# Patient Record
Sex: Male | Born: 1955 | Race: White | Hispanic: No | Marital: Single | Smoking: Never smoker
Health system: Southern US, Community
[De-identification: ages and names within clinical notes are randomized; demographics above are authoritative.]

## PROBLEM LIST (undated history)

## (undated) DIAGNOSIS — D751 Secondary polycythemia: Secondary | ICD-10-CM

## (undated) DIAGNOSIS — E538 Deficiency of other specified B group vitamins: Secondary | ICD-10-CM

## (undated) DIAGNOSIS — E119 Type 2 diabetes mellitus without complications: Secondary | ICD-10-CM

## (undated) DIAGNOSIS — E785 Hyperlipidemia, unspecified: Secondary | ICD-10-CM

## (undated) DIAGNOSIS — I10 Essential (primary) hypertension: Secondary | ICD-10-CM

## (undated) DIAGNOSIS — G709 Myoneural disorder, unspecified: Secondary | ICD-10-CM

## (undated) DIAGNOSIS — K760 Fatty (change of) liver, not elsewhere classified: Secondary | ICD-10-CM

## (undated) DIAGNOSIS — F1020 Alcohol dependence, uncomplicated: Secondary | ICD-10-CM

## (undated) HISTORY — PX: KNEE ARTHROSCOPY: SUR90

## (undated) HISTORY — PX: NECK SURGERY: SHX720

## (undated) HISTORY — DX: Secondary polycythemia: D75.1

---

## 1998-08-29 ENCOUNTER — Encounter: Payer: Self-pay | Admitting: Specialist

## 1998-08-29 ENCOUNTER — Ambulatory Visit (HOSPITAL_COMMUNITY): Admission: RE | Admit: 1998-08-29 | Discharge: 1998-08-29 | Payer: Self-pay | Admitting: Specialist

## 1999-02-28 ENCOUNTER — Encounter: Payer: Self-pay | Admitting: Neurosurgery

## 1999-02-28 ENCOUNTER — Encounter: Admission: RE | Admit: 1999-02-28 | Discharge: 1999-02-28 | Payer: Self-pay | Admitting: Neurosurgery

## 1999-09-13 ENCOUNTER — Encounter: Admission: RE | Admit: 1999-09-13 | Discharge: 1999-09-13 | Payer: Self-pay | Admitting: Family Medicine

## 1999-09-13 ENCOUNTER — Encounter: Payer: Self-pay | Admitting: Family Medicine

## 2000-09-07 ENCOUNTER — Ambulatory Visit (HOSPITAL_COMMUNITY): Admission: RE | Admit: 2000-09-07 | Discharge: 2000-09-07 | Payer: Self-pay | Admitting: *Deleted

## 2009-05-24 ENCOUNTER — Encounter (INDEPENDENT_AMBULATORY_CARE_PROVIDER_SITE_OTHER): Payer: Self-pay | Admitting: *Deleted

## 2009-06-04 ENCOUNTER — Encounter (INDEPENDENT_AMBULATORY_CARE_PROVIDER_SITE_OTHER): Payer: Self-pay

## 2009-06-05 ENCOUNTER — Ambulatory Visit: Payer: Self-pay | Admitting: Gastroenterology

## 2009-06-05 ENCOUNTER — Encounter (INDEPENDENT_AMBULATORY_CARE_PROVIDER_SITE_OTHER): Payer: Self-pay

## 2009-06-19 ENCOUNTER — Ambulatory Visit: Payer: Self-pay | Admitting: Gastroenterology

## 2009-06-21 ENCOUNTER — Encounter: Payer: Self-pay | Admitting: Gastroenterology

## 2010-04-18 NOTE — Letter (Signed)
Summary: Results Letter  [facility]  [address] [place], [place] [address]   Phone: [phone]  Fax: [phone]        June 21, 2009 MRN: [id number]    [facility] [address] [place], [place]  [address]    Dear Johnny Beck,   One of the polyps removed during your recent procedure was proven to be adenomatous.  These are pre-cancerous polyps that may have grown into cancers if they had not been removed.  Based on current nationally recognized surveillance guidelines, I recommend that you have a repeat colonoscopy in 5 years.  We will therefore put your information in our reminder system and will contact you in 5 years to schedule a repeat procedure.  Please call if you have any questions or concerns.       Sincerely,  Rachael Fee MD  This letter has been electronically signed by your physician.  Appended Document: Results Letter Letter mailed 4.8.11

## 2010-04-18 NOTE — Letter (Signed)
Summary: Diabetic Instructions  [facility]  [address] [place], [place] [address]   Phone: [phone]  Fax: [phone]    PADEN SENGER [birth year] MRN: [id number]   _  _   ORAL DIABETIC MEDICATION INSTRUCTIONS  The day before your procedure:   Take your diabetic pill as you do normally  The day of your procedure:   Do not take your diabetic pill    We will check your blood sugar levels during the admission process and again in Recovery before discharging you home  ________________________________________________________________________  _  _   INSULIN (LONG ACTING) MEDICATION INSTRUCTIONS (Lantus, NPH, 70/30, Humulin, Novolin-N)   The day before your procedure:   Take  your regular evening dose    The day of your procedure:   Do not take your morning dose    _  _   INSULIN (SHORT ACTING) MEDICATION INSTRUCTIONS (Regular, Humulog, Novolog)   The day before your procedure:   Do not take your evening dose   The day of your procedure:   Do not take your morning dose   _  _   INSULIN PUMP MEDICATION INSTRUCTIONS  We will contact the physician managing your diabetic care for written dosage instructions for the day before your procedure and the day of your procedure.  Once we have received the instructions, we will contact you.

## 2010-04-18 NOTE — Miscellaneous (Signed)
Summary: Lec previsit  Clinical Lists Changes  Medications: Added new medication of MOVIPREP 100 GM  SOLR (PEG-KCL-NACL-NASULF-NA ASC-C) As per prep instructions. - Signed Rx of MOVIPREP 100 GM  SOLR (PEG-KCL-NACL-NASULF-NA ASC-C) As per prep instructions.;  #1 x 0;  Signed;  Entered by: Ulis Rias RN;  Authorized by: Rachael Fee MD;  Method used: Electronically to [id number] [address], [place], [place], [place]  [address], Ph: [phone], Fax: [phone] Allergies: Added new allergy or adverse reaction of CODEINE Added new allergy or adverse reaction of PENICILLIN Observations: Added new observation of NKA: F (06/05/2009 14:52)    Prescriptions: MOVIPREP 100 GM  SOLR (PEG-KCL-NACL-NASULF-NA ASC-C) As per prep instructions.  #1 x 0   Entered by:   Ulis Rias RN   Authorized by:   Rachael Fee MD   Signed by:   Ulis Rias RN on 06/05/2009   Method used:   [facility]  [address] [id number]       [address]       [place], [place]  [address]       Ph: [phone]       Fax: [phone]   RxID:   [id number]

## 2010-04-18 NOTE — Letter (Signed)
Summary: Previsit letter  [facility]  [address] [place], [place] [address]   Phone: [phone]  Fax: [phone]       05/24/2009 MRN: [id number]  [facility] [address] [place], [place]  [address]  Dear Mr. Germano,  Welcome to the Gastroenterology Division at [facility].    You are scheduled to see a nurse for your pre-procedure visit on 06-05-09 at 3:30P.M. on the 3rd floor at [facility], [address]  We ask that you try to arrive at our office 15 minutes prior to your appointment time to allow for check-in.  Your nurse visit will consist of discussing your medical and surgical history, your immediate family medical history, and your medications.    Please bring a complete list of all your medications or, if you prefer, bring the medication bottles and we will list them.  We will need to be aware of both prescribed and over the counter drugs.  We will need to know exact dosage information as well.  If you are on blood thinners (Coumadin, Plavix, Aggrenox, Ticlid, etc.) please call our office today/prior to your appointment, as we need to consult with your physician about holding your medication.   Please be prepared to read and sign documents such as consent forms, a financial agreement, and acknowledgement forms.  If necessary, and with your consent, a friend or relative is welcome to sit-in on the nurse visit with you.  Please bring your insurance card so that we may make a copy of it.  If your insurance requires a referral to see a specialist, please bring your referral form from your primary care physician.  No co-pay is required for this nurse visit.     If you cannot keep your appointment, please call [phone] to cancel or reschedule prior to your appointment date.  This allows [place] the opportunity to schedule an appointment for another patient in need of care.    Thank you for choosing [facility] for your medical needs.   We appreciate the opportunity to care for you.  Please visit [place] at our website  to learn more about our practice.                     Sincerely.                                                                                                                   The Gastroenterology Division

## 2010-04-18 NOTE — Letter (Signed)
Summary: [facility] Instructions  [facility]  [address] [place], [place] [address]   Phone: [phone]  Fax: [phone]       Johnny Beck    07/21/1955    MRN: [id number]        Procedure Day /Date:  Tuesday 06/19/2009     Arrival Time: 10:00 am      Procedure Time: 11:00 am     Location of Procedure:                    _ x_  [facility] (4th Floor)                        PREPARATION FOR COLONOSCOPY WITH MOVIPREP   Starting 5 days prior to your procedure Thursday 3/31 do not eat nuts, seeds, popcorn, corn, beans, peas,  salads, or any raw vegetables.  Do not take any fiber supplements (e.g. Metamucil, Citrucel, and Benefiber).  THE DAY BEFORE YOUR PROCEDURE         DATE: Mondaay 4/4  1.  Drink clear liquids the entire day-NO SOLID FOOD  2.  Do not drink anything colored red or purple.  Avoid juices with pulp.  No orange juice.  3.  Drink at least 64 oz. (8 glasses) of fluid/clear liquids during the day to prevent dehydration and help the prep work efficiently.  CLEAR LIQUIDS INCLUDE: Water Jello Ice Popsicles Tea (sugar ok, no milk/cream) Powdered fruit flavored drinks Coffee (sugar ok, no milk/cream) Gatorade Juice: apple, white grape, white cranberry  Lemonade Clear bullion, consomm, broth Carbonated beverages (any kind) Strained chicken noodle soup Hard Candy                             4.  In the morning, mix first dose of MoviPrep solution:    Empty 1 Pouch A and 1 Pouch B into the disposable container    Add lukewarm drinking water to the top line of the container. Mix to dissolve    Refrigerate (mixed solution should be used within 24 hrs)  5.  Begin drinking the prep at 5:00 p.m. The MoviPrep container is divided by 4 marks.   Every 15 minutes drink the solution down to the next mark (approximately 8 oz) until the full liter is complete.   6.  Follow completed prep with 16 oz of clear liquid of your choice (Nothing  red or purple).  Continue to drink clear liquids until bedtime.  7.  Before going to bed, mix second dose of MoviPrep solution:    Empty 1 Pouch A and 1 Pouch B into the disposable container    Add lukewarm drinking water to the top line of the container. Mix to dissolve    Refrigerate  THE DAY OF YOUR PROCEDURE      DATE: Tuesday 4/5  Beginning at 6:00 a.m. (5 hours before procedure):         1. Every 15 minutes, drink the solution down to the next mark (approx 8 oz) until the full liter is complete.  2. Follow completed prep with 16 oz. of clear liquid of your choice.    3. You may drink clear liquids until 9:00 am  (2 HOURS BEFORE PROCEDURE).   MEDICATION INSTRUCTIONS  Unless otherwise instructed, you should take regular prescription medications with a small sip of water   as early as possible the morning  of your procedure.  Diabetic patients - see separate instructions         OTHER INSTRUCTIONS  You will need a responsible adult at least 54 years of age to accompany you and drive you home.   This person must remain in the waiting room during your procedure.  Wear loose fitting clothing that is easily removed.  Leave jewelry and other valuables at home.  However, you may wish to bring a book to read or  an iPod/MP3 player to listen to music as you wait for your procedure to start.  Remove all body piercing jewelry and leave at home.  Total time from sign-in until discharge is approximately 2-3 hours.  You should go home directly after your procedure and rest.  You can resume normal activities the  day after your procedure.  The day of your procedure you should not:   Drive   Make legal decisions   Operate machinery   Drink alcohol   Return to work  You will receive specific instructions about eating, activities and medications before you leave.    The above instructions have been reviewed and explained to me by   Ulis Rias RN  June 05, 2009  3:50 PM     I fully understand and can verbalize these instructions _____________________________ Date _________

## 2010-04-18 NOTE — Procedures (Signed)
Summary: Colonoscopy  Patient: Johnny Beck Note: All result statuses are Final unless otherwise noted.  Tests: (1) Colonoscopy (COL)   COL Colonoscopy           DONE     [facility]     [address]     [place], [place]  [address]           COLONOSCOPY PROCEDURE REPORT     PATIENT:  [name], [name]  MR#:  [id number]     BIRTHDATE:  1956/03/05, 54 yrs. old  GENDER:  male     ENDOSCOPIST:  Rachael Fee, MD     REF. BY:  Tomi Bamberger, NP     PROCEDURE DATE:  06/19/2009     PROCEDURE:  Colonoscopy with snare polypectomy     ASA CLASS:  Class II     INDICATIONS:  Routine Risk Screening     MEDICATIONS:   Fentanyl 100 mcg IV, Versed 10 mg IV     DESCRIPTION OF PROCEDURE:   After the risks benefits and     alternatives of the procedure were thoroughly explained, informed     consent was obtained.  Digital rectal exam was performed and     revealed no rectal masses.   The LB CF-H180AL [id number] endoscope     was introduced through the anus and advanced to the cecum, which     was identified by both the appendix and ileocecal valve, without     limitations.  The quality of the prep was adequate, using     MoviPrep.  The instrument was then slowly withdrawn as the colon     was fully examined.     <<PROCEDUREIMAGES>>     FINDINGS:  Three sessile polyps were found, removed with cold     snare, all were retrieved and sent to pathology (jar 1). These     ranged in size from 3mm to 5mm. One was in descending colon, one     in sigmoid colon, one in rectum (see image3, image4, and image5).     This was otherwise a normal examination of the colon (see image6,     image2, and image1).   Retroflexed views in the rectum revealed no     abnormalities.    The scope was then withdrawn from the patient     and the procedure completed.           COMPLICATIONS:  None     ENDOSCOPIC IMPRESSION:     1) Three small polyps; all were removed and sent to pathology     (all <1cm in size)     2)  Otherwise normal examination           RECOMMENDATIONS:     1) If the polyp(s) removed today are proven to be adenomatous     (pre-cancerous) polyps, you will need a colonoscopy in 3-5 years.     Otherwise you should continue to follow colorectal cancer     screening guidelines for "routine risk" patients with a     colonoscopy in 10 years.     2) You will receive a letter within 1-2 weeks with the results     of your biopsy as well as final recommendations. Please call my     office if you have not received a letter after 3 weeks.           ______________________________     Rachael Fee, MD  n.     eSIGNED:   Rachael Fee at 06/19/2009 11:42 AM           Lindie Spruce, [phone]  Note: An exclamation mark (!) indicates a result that was not dispersed into the flowsheet. Document Creation Date: 06/19/2009 11:43 AM _______________________________________________________________________  (1) Order result status: Final Collection or observation date-time: 06/19/2009 11:39 Requested date-time:  Receipt date-time:  Reported date-time:  Referring Physician:   Ordering Physician: Rob Bunting [id number]) Specimen Source:  Source: Launa Grill Order Number: [id number] Lab site:   Appended Document: Colonoscopy     Procedures Next Due Date:    Colonoscopy: 06/2014

## 2010-06-05 LAB — GLUCOSE, CAPILLARY
Glucose-Capillary: 104 mg/dL — ABNORMAL HIGH (ref 70–99)
Glucose-Capillary: 133 mg/dL — ABNORMAL HIGH (ref 70–99)

## 2010-08-02 NOTE — Cardiovascular Report (Signed)
[facility]  Patient:    Johnny Beck, Johnny Beck                       MRN: [id number] Proc. Date: 09/07/00 Adm. Date:  [id number] Disc. Date: [id number] Attending:  Meade Maw A                        Cardiac Catheterization  INDICATIONS FOR PROCEDURE:  The patient is a 55 year old gentleman with complaints of intermittent chest pain, dyspnea with exertion which has become progressively worse, associated symptoms including peripheral edema. Transesophageal echocardiography was performed and was suggestive of an ejection fraction of 40-50%.  DESCRIPTION OF PROCEDURE:  After obtaining written informed consent, the patient was brought to the cardiac catheterization lab in the postabsorptive state.  Preoperative sedation was achieved using IV Versed.  The right groin was prepped and draped in a usual sterile fashion.  Local anesthesia was achieved using 1% Xylocaine.  IV sedation was achieved using IV Versed and IV fentanyl.  After localization, an 8 [place] hemostasis sheath was placed into the right femoral vein using a modified Seldinger technique.  A 6 French hemostasis sheath was placed into the right femoral artery using the modified Seldinger technique.  Pulmonary artery pressure, right ventricular pressure and right atrial pressure were obtained.  O2 saturations from the pulmonary artery and arterial saturations were obtained to perform Fick cardiac output. Following right heart catheterization, a single plane ventriculogram was performed in the RAO position using a 6 French pigtail Cook catheter. Selective coronary angiography was then performed using a JL4 and a non torque right coronary artery.  All catheter exchange were made over a guide wire. The hemostasis sheath was flushed following each engagement.  The films were then reviewed.  There was no identifiable coronary artery disease.  FINDINGS:  The aortic pressure was 142/92, LV pressure was  146/19.  There was no gradient noted on pullback.  Pulmonary artery pressure was 23/14, wedge pressure with a mean of 12, right ventricular pressure 29/12, right atrial pressure 15/11.  Cardiac outputs by thermodilution was 6.81, cardiac index was 2.77.  At the time of this dictation, O2 saturations from the pulmonary artery and femoral artery were not available.  Single plane ventriculogram revealed non hypokinesis.  The ejection fraction is approximately 50%.  There is no mitral regurgitation noted.  CORONARY ANGIOGRAPHY:  Left main coronary artery:  The left main coronary artery bifurcated into a very small, almost nonexistent circumflex and a small to moderate LAD.  There was no significant disease in the left main coronary artery.  Left anterior descending:  The left anterior descending gave rise to a small diagonal #1, small to moderate diagonal #2, small diagonal #3, small diagonal #4 and ended as an apical recurrent.  There was no significant disease in the left anterior descending or its branches.  Circumflex vessel:  The circumflex vessel was almost a nonexistent branch.  Right coronary artery:  The right coronary artery was a very large, dominant artery providing the entire circulation to the lateral wall.  It gave rise to a small RV marginal #1, small RV marginal #2, very large PDA and three large PL branches which provided the lateral circulation.  FINAL IMPRESSION:  Normal coronary angiography, normal single plane ventriculogram.  Normal cardiac index, normal right heart catheterization pressure.  It is felt that he has mild hypokinesis from excessive alcohol intake.  His dyspnea is most  likely related to deconditioning and obesity.  These findings have been discussed with Lindie Spruce.  Diet and weight reduction have been discussed. DD:  09/07/00 TD:  09/08/00 Job: [id number]

## 2011-11-24 ENCOUNTER — Ambulatory Visit: Payer: Self-pay | Admitting: Unknown Physician Specialty

## 2011-12-16 ENCOUNTER — Ambulatory Visit: Payer: Self-pay | Admitting: Unknown Physician Specialty

## 2012-01-16 ENCOUNTER — Ambulatory Visit: Payer: Self-pay | Admitting: Unknown Physician Specialty

## 2013-03-14 ENCOUNTER — Ambulatory Visit: Payer: Self-pay

## 2014-06-27 ENCOUNTER — Encounter: Payer: Self-pay | Admitting: Gastroenterology

## 2014-10-18 ENCOUNTER — Encounter: Payer: Self-pay | Admitting: *Deleted

## 2014-10-19 ENCOUNTER — Encounter
Admission: RE | Disposition: A | Discharge status: Home / Self Care | Payer: Self-pay | Source: Ambulatory Visit | Attending: Gastroenterology

## 2014-10-19 ENCOUNTER — Ambulatory Visit: Payer: Medicare Other | Admitting: Anesthesiology

## 2014-10-19 ENCOUNTER — Ambulatory Visit
Admission: RE | Admit: 2014-10-19 | Discharge: 2014-10-19 | Disposition: A | Discharge status: Home / Self Care | Payer: Medicare Other | Source: Ambulatory Visit | Attending: Gastroenterology | Admitting: Gastroenterology

## 2014-10-19 DIAGNOSIS — Z1211 Encounter for screening for malignant neoplasm of colon: Secondary | ICD-10-CM | POA: Diagnosis not present

## 2014-10-19 DIAGNOSIS — Z86010 Personal history of colonic polyps: Secondary | ICD-10-CM | POA: Diagnosis not present

## 2014-10-19 DIAGNOSIS — E785 Hyperlipidemia, unspecified: Secondary | ICD-10-CM | POA: Insufficient documentation

## 2014-10-19 DIAGNOSIS — E119 Type 2 diabetes mellitus without complications: Secondary | ICD-10-CM | POA: Diagnosis not present

## 2014-10-19 DIAGNOSIS — I10 Essential (primary) hypertension: Secondary | ICD-10-CM | POA: Diagnosis not present

## 2014-10-19 HISTORY — DX: Hyperlipidemia, unspecified: E78.5

## 2014-10-19 HISTORY — DX: Essential (primary) hypertension: I10

## 2014-10-19 HISTORY — DX: Type 2 diabetes mellitus without complications: E11.9

## 2014-10-19 HISTORY — DX: Myoneural disorder, unspecified: G70.9

## 2014-10-19 HISTORY — DX: Alcohol dependence, uncomplicated: F10.20

## 2014-10-19 HISTORY — DX: Deficiency of other specified B group vitamins: E53.8

## 2014-10-19 HISTORY — DX: Fatty (change of) liver, not elsewhere classified: K76.0

## 2014-10-19 HISTORY — PX: COLONOSCOPY WITH PROPOFOL: SHX5780

## 2014-10-19 LAB — GLUCOSE, CAPILLARY: Glucose-Capillary: 142 mg/dL — ABNORMAL HIGH (ref 65–99)

## 2014-10-19 SURGERY — COLONOSCOPY WITH PROPOFOL
Anesthesia: General

## 2014-10-19 MED ORDER — SODIUM CHLORIDE 0.9 % IV SOLN
INTRAVENOUS | Status: DC
  Administered 2014-10-19: 1000 mL via INTRAVENOUS

## 2014-10-19 MED ORDER — LIDOCAINE HCL (CARDIAC) 20 MG/ML IV SOLN
INTRAVENOUS | Status: DC | PRN
  Administered 2014-10-19: 20 mg via INTRAVENOUS

## 2014-10-19 MED ORDER — PROPOFOL 10 MG/ML IV BOLUS
INTRAVENOUS | Status: DC | PRN
  Administered 2014-10-19: 110 mg via INTRAVENOUS

## 2014-10-19 MED ORDER — SODIUM CHLORIDE 0.9 % IV SOLN
INTRAVENOUS | Status: DC
Start: 2014-10-19 — End: 2014-10-19
  Administered 2014-10-19: 09:00:00 via INTRAVENOUS

## 2014-10-19 MED ORDER — PROPOFOL INFUSION 10 MG/ML OPTIME
INTRAVENOUS | Status: DC | PRN
  Administered 2014-10-19: 100 ug/kg/min via INTRAVENOUS

## 2014-10-19 NOTE — Op Note (Signed)
[facility] Patient Name: Johnny Beck Procedure Date: 10/19/2014 8:31 AM MRN: [id number] Account #: [id number] Date of Birth: 11/15/1955 Admit Type: Outpatient Age: 59 Room: [facility] ENDO ROOM 4 Gender: Male Note Status: Finalized Procedure:         Colonoscopy Indications:       Personal history of colonic polyps Providers:         Lupita Dawn. Candace Cruise, MD Referring MD:      Youlanda Roys. Ola Spurr, MD (Referring MD) Medicines:         Monitored Anesthesia Care Complications:     No immediate complications. Procedure:         Pre-Anesthesia Assessment:                    - Prior to the procedure, a History and Physical was                     performed, and patient medications, allergies and                     sensitivities were reviewed. The patient's tolerance of                     previous anesthesia was reviewed.                    - The risks and benefits of the procedure and the sedation                     options and risks were discussed with the patient. All                     questions were answered and informed consent was obtained.                    - After reviewing the risks and benefits, the patient was                     deemed in satisfactory condition to undergo the procedure.                    After obtaining informed consent, the colonoscope was                     passed under direct vision. Throughout the procedure, the                     patient's blood pressure, pulse, and oxygen saturations                     were monitored continuously. The Olympus PCF-H180AL                     colonoscope ( S#: [id number] ) was introduced through the                     anus and advanced to the the cecum, identified by                     appendiceal orifice and ileocecal valve. The colonoscopy                     was performed without difficulty. The patient tolerated  the procedure well. The colonoscopy was performed without                    difficulty. The patient tolerated the procedure well. The                     quality of the bowel preparation was fair. Findings:      The colon (entire examined portion) appeared normal. Impression:        - The entire examined colon is normal.                    - No specimens collected. Recommendation:    - Discharge patient to home.                    - Repeat colonoscopy in 5 years for surveillance.                    - The findings and recommendations were discussed with the                     patient. Procedure Code(s): --- Professional ---                    [phone], Colonoscopy, flexible; diagnostic, including                     collection of specimen(s) by brushing or washing, when                     performed (separate procedure) Diagnosis Code(s): --- Professional ---                    Z86.010, Personal history of colonic polyps CPT copyright 2014 American Medical Association. All rights reserved. The codes documented in this report are preliminary and upon coder review may  be revised to meet current compliance requirements. Hulen Luster, MD 10/19/2014 9:00:43 AM This report has been signed electronically. Number of Addenda: 0 Note Initiated On: 10/19/2014 8:31 AM Scope Withdrawal Time: 0 hours 8 minutes 37 seconds  Total Procedure Duration: 0 hours 12 minutes 5 seconds       [facility]

## 2014-10-19 NOTE — Anesthesia Preprocedure Evaluation (Signed)
Anesthesia Evaluation  Patient identified by MRN, date of birth, ID band Patient awake    Reviewed: Allergy & Precautions, H&P , NPO status , Patient's Chart, lab work & pertinent test results, reviewed documented beta blocker date and time   Airway Mallampati: III  TM Distance: >3 FB Neck ROM: full    Dental no notable dental hx. (+) Teeth Intact   Pulmonary neg pulmonary ROS,  breath sounds clear to auscultation  Pulmonary exam normal       Cardiovascular Exercise Tolerance: Good hypertension, - angina- CAD, - Past MI and - CABG Normal cardiovascular examRhythm:regular Rate:Normal     Neuro/Psych  Neuromuscular disease (diabetic peripheral neuropathy) negative psych ROS   GI/Hepatic negative GI ROS, Fatty liver disease   Endo/Other  negative endocrine ROSdiabetes, Well Controlled, Oral Hypoglycemic Agents  Renal/GU negative Renal ROS  negative genitourinary   Musculoskeletal   Abdominal   Peds  Hematology negative hematology ROS (+)   Anesthesia Other Findings Past Medical History:   Diabetes mellitus without complication                       Hypertension                                                 Neuromuscular disorder                                         Comment:Peripheral Neuropathy   B12 deficiency                                               Hyperlipidemia                                               Fatty liver                                                  EtOH dependence                                              Reproductive/Obstetrics negative OB ROS                             Anesthesia Physical Anesthesia Plan  ASA: III  Anesthesia Plan: General   Post-op Pain Management:    Induction:   Airway Management Planned:   Additional Equipment:   Intra-op Plan:   Post-operative Plan:   Informed Consent: I have reviewed the patients History and  Physical, chart, labs and discussed the procedure including the risks, benefits and alternatives for the proposed anesthesia with the patient or authorized representative who has indicated his/her understanding and acceptance.  Dental Advisory Given  Plan Discussed with: Anesthesiologist, CRNA and Surgeon  Anesthesia Plan Comments:         Anesthesia Quick Evaluation

## 2014-10-19 NOTE — Anesthesia Postprocedure Evaluation (Signed)
  Anesthesia Post-op Note  Patient: Johnny Beck  Procedure(s) Performed: Procedure(s): COLONOSCOPY WITH PROPOFOL (N/A)  Anesthesia type:General  Patient location: PACU  Post pain: Pain level controlled  Post assessment: Post-op Vital signs reviewed, Patient's Cardiovascular Status Stable, Respiratory Function Stable, Patent Airway and No signs of Nausea or vomiting  Post vital signs: Reviewed and stable  Last Vitals:  Filed Vitals:   10/19/14 0945  BP: 146/89  Pulse: 69  Temp:   Resp: 14    Level of consciousness: awake, alert  and patient cooperative  Complications: No apparent anesthesia complications

## 2014-10-19 NOTE — H&P (Signed)
    Primary Care Physician:  Adrian Prows, MD Primary Gastroenterologist:  Dr. Candace Cruise  Pre-Procedure History & Physical: HPI:  Johnny Beck is a 59 y.o. male is here for an colonoscopy.   Past Medical History  Diagnosis Date  . Diabetes mellitus without complication   . Hypertension   . Neuromuscular disorder     Peripheral Neuropathy  . B12 deficiency   . Hyperlipidemia   . Fatty liver   . EtOH dependence     Past Surgical History  Procedure Laterality Date  . Neck surgery    . Knee arthroscopy      Prior to Admission medications   Medication Sig Start Date End Date Taking? Authorizing Provider  cyanocobalamin 1000 MCG tablet Take 100 mcg by mouth daily.   Yes Historical Provider, MD  lisinopril (PRINIVIL,ZESTRIL) 10 MG tablet Take 10 mg by mouth daily.   Yes Historical Provider, MD  metFORMIN (GLUCOPHAGE) 1000 MG tablet Take 1,000 mg by mouth 2 (two) times daily with a meal.   Yes Historical Provider, MD  OMEGA-3 FATTY ACIDS-VITAMIN E PO Take 1,000 mg by mouth.   Yes Historical Provider, MD    Allergies as of 09/26/2014 - reviewed 06/05/2009  Allergen Reaction Noted  . Codeine  06/05/2009  . Penicillins  06/05/2009    History reviewed. No pertinent family history.  History   Social History  . Marital Status: Single    Spouse Name: N/A  . Number of Children: N/A  . Years of Education: N/A   Occupational History  . Not on file.   Social History Main Topics  . Smoking status: Never Smoker   . Smokeless tobacco: Never Used  . Alcohol Use: 6.0 oz/week    10 Shots of liquor per week  . Drug Use: No  . Sexual Activity: Not on file   Other Topics Concern  . Not on file   Social History Narrative    Review of Systems: See HPI, otherwise negative ROS  Physical Exam: BP 148/96 mmHg  Pulse 80  Temp(Src) 98.1 F (36.7 C) (Tympanic)  Resp 17  Ht 6\' 1"  (1.854 m)  Wt 113.399 kg (250 lb)  BMI 32.99 kg/m2  SpO2 98% General:   Alert,  pleasant and  cooperative in NAD Head:  Normocephalic and atraumatic. Neck:  Supple; no masses or thyromegaly. Lungs:  Clear throughout to auscultation.    Heart:  Regular rate and rhythm. Abdomen:  Soft, nontender and nondistended. Normal bowel sounds, without guarding, and without rebound.   Neurologic:  Alert and  oriented x4;  grossly normal neurologically.  Impression/Plan: Johnny Beck is here for an colonoscopy to be performed for personal hx of colon polyps  Risks, benefits, limitations, and alternatives regarding colonoscopy have been reviewed with the patient.  Questions have been answered.  All parties agreeable.   Bridgett Hattabaugh, Lupita Dawn, MD  10/19/2014, 8:01 AM

## 2014-10-19 NOTE — Transfer of Care (Signed)
Immediate Anesthesia Transfer of Care Note  Patient: Johnny Beck  Procedure(s) Performed: Procedure(s): COLONOSCOPY WITH PROPOFOL (N/A)  Patient Location: PACU and Endoscopy Unit  Anesthesia Type:General  Level of Consciousness: awake, alert  and oriented  Airway & Oxygen Therapy: Patient Spontanous Breathing and Patient connected to nasal cannula oxygen  Post-op Assessment: Report given to RN and Post -op Vital signs reviewed and stable  Post vital signs: Reviewed and stable  Last Vitals:  Filed Vitals:   10/19/14 0905  BP: 115/58  Pulse: 72  Temp:   Resp: 16    Complications: No apparent anesthesia complications

## 2015-07-26 ENCOUNTER — Encounter: Payer: Self-pay | Admitting: *Deleted

## 2015-07-27 ENCOUNTER — Inpatient Hospital Stay: Payer: Medicare Other

## 2015-07-27 ENCOUNTER — Inpatient Hospital Stay: Payer: Medicare Other | Attending: Internal Medicine | Admitting: Internal Medicine

## 2015-07-27 VITALS — BP 156/91 | HR 85 | Temp 97.7°F | Resp 18 | Wt 250.9 lb

## 2015-07-27 DIAGNOSIS — R5382 Chronic fatigue, unspecified: Secondary | ICD-10-CM | POA: Insufficient documentation

## 2015-07-27 DIAGNOSIS — I10 Essential (primary) hypertension: Secondary | ICD-10-CM | POA: Diagnosis not present

## 2015-07-27 DIAGNOSIS — E119 Type 2 diabetes mellitus without complications: Secondary | ICD-10-CM | POA: Diagnosis not present

## 2015-07-27 DIAGNOSIS — E785 Hyperlipidemia, unspecified: Secondary | ICD-10-CM | POA: Diagnosis not present

## 2015-07-27 DIAGNOSIS — Z7984 Long term (current) use of oral hypoglycemic drugs: Secondary | ICD-10-CM

## 2015-07-27 DIAGNOSIS — D751 Secondary polycythemia: Secondary | ICD-10-CM

## 2015-07-27 DIAGNOSIS — K760 Fatty (change of) liver, not elsewhere classified: Secondary | ICD-10-CM | POA: Insufficient documentation

## 2015-07-27 DIAGNOSIS — Z79899 Other long term (current) drug therapy: Secondary | ICD-10-CM | POA: Diagnosis not present

## 2015-07-27 DIAGNOSIS — F1020 Alcohol dependence, uncomplicated: Secondary | ICD-10-CM

## 2015-07-27 DIAGNOSIS — G629 Polyneuropathy, unspecified: Secondary | ICD-10-CM | POA: Diagnosis not present

## 2015-07-27 DIAGNOSIS — E538 Deficiency of other specified B group vitamins: Secondary | ICD-10-CM | POA: Diagnosis not present

## 2015-07-27 LAB — CBC WITH DIFFERENTIAL/PLATELET
BASOS ABS: 0.1 10*3/uL (ref 0–0.1)
BASOS PCT: 1 %
EOS ABS: 0.3 10*3/uL (ref 0–0.7)
EOS PCT: 6 %
HCT: 49.7 % (ref 40.0–52.0)
Hemoglobin: 17.3 g/dL (ref 13.0–18.0)
LYMPHS PCT: 29 %
Lymphs Abs: 1.6 10*3/uL (ref 1.0–3.6)
MCH: 35 pg — ABNORMAL HIGH (ref 26.0–34.0)
MCHC: 34.7 g/dL (ref 32.0–36.0)
MCV: 100.8 fL — AB (ref 80.0–100.0)
Monocytes Absolute: 0.6 10*3/uL (ref 0.2–1.0)
Monocytes Relative: 10 %
Neutro Abs: 3.1 10*3/uL (ref 1.4–6.5)
Neutrophils Relative %: 54 %
PLATELETS: 146 10*3/uL — AB (ref 150–440)
RBC: 4.93 MIL/uL (ref 4.40–5.90)
RDW: 13.6 % (ref 11.5–14.5)
WBC: 5.7 10*3/uL (ref 3.8–10.6)

## 2015-07-27 LAB — URINALYSIS COMPLETE WITH MICROSCOPIC (ARMC ONLY)
BILIRUBIN URINE: NEGATIVE
Bacteria, UA: NONE SEEN
GLUCOSE, UA: NEGATIVE mg/dL
HGB URINE DIPSTICK: NEGATIVE
KETONES UR: NEGATIVE mg/dL
LEUKOCYTES UA: NEGATIVE
NITRITE: NEGATIVE
PH: 5 (ref 5.0–8.0)
Protein, ur: NEGATIVE mg/dL
SPECIFIC GRAVITY, URINE: 1.016 (ref 1.005–1.030)
Squamous Epithelial / LPF: NONE SEEN

## 2015-07-27 LAB — LACTATE DEHYDROGENASE: LDH: 125 U/L (ref 98–192)

## 2015-07-27 NOTE — Progress Notes (Signed)
Patient here today as new evaluation regarding erythrocytosis.  Referred by Dr. Ola Spurr.  Patient complains of diabetic neuropathy in bilateral feet.

## 2015-07-27 NOTE — Progress Notes (Signed)
[place] NOTE  Patient Care Team: Leonel Ramsay, MD as PCP - General (Infectious Diseases)  CHIEF COMPLAINTS/PURPOSE OF CONSULTATION:   # April 2017- ERYTHROCYTOSIS [facility] &H-18 & 51]  # Peripheral Neuropathy/ alcohol dependance/ DM  HISTORY OF PRESENTING ILLNESS:  Johnny Beck 60 y.o.  male pleasant patient with history of diabetes history of alcohol dependence; nonsmoker referred to [place] for further evaluation of erythrocytosis.  Patient review of labs-showed- normal hemoglobin and hematocrit year ago; however more recently in April 2017 hemoglobin is up to 18 hematocrit 51. Patient complains of chronic fatigue for the last 2 years. This is neither better nor worse. Denies any weight loss. Denies any loss of appetite. No cough or shortness of breath.   Patient is chronic tingling and numbness of his feet; otherwise no purplish discoloration of the feet or fingertips. Patient denies any testosterone gels/denies snoring.  ROS: A complete 10 point review of system is done which is negative except mentioned above in history of present illness  MEDICAL HISTORY:  Past Medical History  Diagnosis Date  . Diabetes mellitus without complication ([place])   . Hypertension   . Neuromuscular disorder (HCC)     Peripheral Neuropathy  . B12 deficiency   . Hyperlipidemia   . Fatty liver   . EtOH dependence ([place])   . Erythrocytosis     SURGICAL HISTORY: Past Surgical History  Procedure Laterality Date  . Neck surgery    . Knee arthroscopy    . Colonoscopy with propofol N/A 10/19/2014    Procedure: COLONOSCOPY WITH PROPOFOL;  Surgeon: Hulen Luster, MD;  Location: [facility] ENDOSCOPY;  Service: Gastroenterology;  Laterality: N/A;    SOCIAL HISTORY: Social History   Social History  . Marital Status: Single    Spouse Name: N/A  . Number of Children: N/A  . Years of Education: N/A   Occupational History  . Not on file.   Social History Main Topics  . Smoking status:  Never Smoker   . Smokeless tobacco: Never Used  . Alcohol Use: 6.0 oz/week    10 Shots of liquor per week  . Drug Use: No  . Sexual Activity: Not on file   Other Topics Concern  . Not on file   Social History Narrative    FAMILY HISTORY: No family history on file.  ALLERGIES:  is allergic to codeine; eszopiclone; and penicillins.  MEDICATIONS:  Current Outpatient Prescriptions  Medication Sig Dispense Refill  . colchicine 0.6 MG tablet     . cyanocobalamin 1000 MCG tablet Take 100 mcg by mouth daily.    Marland Kitchen lisinopril (PRINIVIL,ZESTRIL) 10 MG tablet Take 10 mg by mouth daily.    . metFORMIN (GLUCOPHAGE) 1000 MG tablet Take 1,000 mg by mouth 2 (two) times daily with a meal.    . OMEGA-3 FATTY ACIDS-VITAMIN E PO Take 1,000 mg by mouth.     No current facility-administered medications for this visit.      Marland Kitchen  PHYSICAL EXAMINATION:   Filed Vitals:   07/27/15 1532  BP: 156/91  Pulse: 85  Temp: 97.7 F (36.5 C)  Resp: 18   Filed Weights   07/27/15 1532  Weight: 250 lb 14.1 oz (113.8 kg)    GENERAL: Well-nourished well-developed; Alert, no distress and comfortable.   Ruddy complexion.  EYES: no pallor or icterus OROPHARYNX: no thrush or ulceration; good dentition  NECK: supple, no masses felt LYMPH:  no palpable lymphadenopathy in the cervical, axillary or inguinal regions  LUNGS: clear to auscultation and  No wheeze or crackles HEART/CVS: regular rate & rhythm and no murmurs; No lower extremity edema ABDOMEN: abdomen soft, non-tender and normal bowel sounds Musculoskeletal:no cyanosis of digits and no clubbing  PSYCH: alert & oriented x 3 with fluent speech NEURO: no focal motor/sensory deficits SKIN:  no rashes or significant lesions  LABORATORY DATA:  I have reviewed the data as listed No results found for: WBC, HGB, HCT, MCV, PLT No results for input(s): NA, K, CL, CO2, GLUCOSE, BUN, CREATININE, CALCIUM, GFRNONAA, GFRAA, PROT, ALBUMIN, AST, ALT, ALKPHOS,  BILITOT, BILIDIR, IBILI in the last 8760 hours.   ASSESSMENT & PLAN:   # Erythrocytosis- hemoglobin 18.1 hematocrit 51 unclear etiology. Check CBC LDH jack 2 mutation and erythropoietin levels. Patient is fatigued / question symptoms from elevated hematocrit.  # Discussed with the patient that based upon above workup; I will recommend phlebotomy/next visit. Patient is agreeable.  # Patient follow-up with me in 2-3 weeks/review the results of phlebotomy.   Thank you Dr. Ola Spurr  for allowing me to participate in the care of your pleasant patient. Please do not hesitate to contact me with questions or concerns in the interim.  # 30 minutes face-to-face with the patient discussing the above plan of care; more than 50% of time spent on counseling and coordination.    Cammie Sickle, MD 07/27/2015 3:52 PM

## 2015-08-06 LAB — JAK2  V617F QUAL. WITH REFLEX TO EXON 12

## 2015-08-06 LAB — JAK2 EXON 12 MUTATION ANALYSIS

## 2015-08-06 LAB — ERYTHROPOIETIN: Erythropoietin: 10.2 m[IU]/mL (ref 2.6–18.5)

## 2015-08-10 ENCOUNTER — Inpatient Hospital Stay: Payer: Medicare Other

## 2015-08-10 ENCOUNTER — Inpatient Hospital Stay: Payer: Medicare Other | Admitting: Internal Medicine

## 2015-08-29 ENCOUNTER — Inpatient Hospital Stay: Payer: Medicare Other

## 2015-08-29 ENCOUNTER — Inpatient Hospital Stay: Payer: Medicare Other | Attending: Internal Medicine | Admitting: Internal Medicine

## 2015-08-29 VITALS — BP 131/87 | HR 102 | Temp 98.3°F | Resp 18 | Wt 250.2 lb

## 2015-08-29 DIAGNOSIS — G4730 Sleep apnea, unspecified: Secondary | ICD-10-CM | POA: Diagnosis not present

## 2015-08-29 DIAGNOSIS — E119 Type 2 diabetes mellitus without complications: Secondary | ICD-10-CM | POA: Diagnosis not present

## 2015-08-29 DIAGNOSIS — F1020 Alcohol dependence, uncomplicated: Secondary | ICD-10-CM

## 2015-08-29 DIAGNOSIS — D751 Secondary polycythemia: Secondary | ICD-10-CM | POA: Insufficient documentation

## 2015-08-29 DIAGNOSIS — E785 Hyperlipidemia, unspecified: Secondary | ICD-10-CM | POA: Insufficient documentation

## 2015-08-29 DIAGNOSIS — Z7984 Long term (current) use of oral hypoglycemic drugs: Secondary | ICD-10-CM | POA: Insufficient documentation

## 2015-08-29 DIAGNOSIS — R5382 Chronic fatigue, unspecified: Secondary | ICD-10-CM

## 2015-08-29 DIAGNOSIS — R5383 Other fatigue: Secondary | ICD-10-CM

## 2015-08-29 DIAGNOSIS — K700 Alcoholic fatty liver: Secondary | ICD-10-CM | POA: Diagnosis not present

## 2015-08-29 DIAGNOSIS — E538 Deficiency of other specified B group vitamins: Secondary | ICD-10-CM

## 2015-08-29 DIAGNOSIS — R202 Paresthesia of skin: Secondary | ICD-10-CM

## 2015-08-29 DIAGNOSIS — G629 Polyneuropathy, unspecified: Secondary | ICD-10-CM | POA: Insufficient documentation

## 2015-08-29 DIAGNOSIS — R200 Anesthesia of skin: Secondary | ICD-10-CM | POA: Diagnosis not present

## 2015-08-29 DIAGNOSIS — I10 Essential (primary) hypertension: Secondary | ICD-10-CM | POA: Diagnosis not present

## 2015-08-29 DIAGNOSIS — R0683 Snoring: Secondary | ICD-10-CM | POA: Insufficient documentation

## 2015-08-29 DIAGNOSIS — Z79899 Other long term (current) drug therapy: Secondary | ICD-10-CM | POA: Insufficient documentation

## 2015-08-29 NOTE — Progress Notes (Signed)
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  male is here to review the results of his blood work ordered for elevated hemoglobin/hematocrit.  Patient continues to have mild fatigue. Denies any unusual weight loss or night sweats. Denies any headaches.  Patient is chronic tingling and numbness of his feet; patient admits to snoring/frequently getting up at night. Patient has not had sleep study.  ROS: A complete 10 point review of system is done which is negative except mentioned above in history of present illness  MEDICAL HISTORY:  Past Medical History  Diagnosis Date  . Diabetes mellitus without complication ([place])   . Hypertension   . Neuromuscular disorder (HCC)     Peripheral Neuropathy  . B12 deficiency   . Hyperlipidemia   . Fatty liver   . EtOH dependence ([place])   . Erythrocytosis     SURGICAL HISTORY: Past Surgical History  Procedure Laterality Date  . Neck surgery    . Knee arthroscopy    . Colonoscopy with propofol N/A 10/19/2014    Procedure: COLONOSCOPY WITH PROPOFOL;  Surgeon: Hulen Luster, MD;  Location: [facility] ENDOSCOPY;  Service: Gastroenterology;  Laterality: N/A;    SOCIAL HISTORY: Social History   Social History  . Marital Status: Single    Spouse Name: N/A  . Number of Children: N/A  . Years of Education: N/A   Occupational History  . Not on file.   Social History Main Topics  . Smoking status: Never Smoker   . Smokeless tobacco: Never Used  . Alcohol Use: 6.0 oz/week    10 Shots of liquor per week  . Drug Use: No  . Sexual Activity: Not on file   Other Topics Concern  . Not on file   Social History Narrative    FAMILY  HISTORY: No family history on file.  ALLERGIES:  is allergic to codeine; eszopiclone; and penicillins.  MEDICATIONS:  Current Outpatient Prescriptions  Medication Sig Dispense Refill  . colchicine 0.6 MG tablet     . cyanocobalamin 1000 MCG tablet Take 100 mcg by mouth daily.    Marland Kitchen lisinopril (PRINIVIL,ZESTRIL) 10 MG tablet Take 10 mg by mouth daily.    . metFORMIN (GLUCOPHAGE) 1000 MG tablet Take 1,000 mg by mouth 2 (two) times daily with a meal.    . OMEGA-3 FATTY ACIDS-VITAMIN E PO Take 1,000 mg by mouth.     No current facility-administered medications for this visit.      Marland Kitchen  PHYSICAL EXAMINATION:   Filed Vitals:   08/29/15 1431  BP: 131/87  Pulse: 102  Temp: 98.3 F (36.8 C)  Resp: 18   Filed Weights   08/29/15 1431  Weight: 250 lb 3.6 oz (113.5 kg)    GENERAL: Well-nourished well-developed; Alert, no distress and comfortable.   Ruddy complexion.  EYES: no pallor or icterus OROPHARYNX: no thrush or ulceration; good dentition  NECK: supple, no masses felt LYMPH:  no palpable lymphadenopathy in the cervical, axillary or inguinal regions LUNGS: clear to auscultation and  No wheeze or crackles HEART/CVS: regular rate & rhythm and no murmurs; No lower extremity edema ABDOMEN: abdomen soft, non-tender and normal bowel sounds Musculoskeletal:no cyanosis of digits and no clubbing  PSYCH: alert & oriented x 3 with fluent speech NEURO: no focal motor/sensory deficits SKIN:  no rashes or significant lesions  LABORATORY DATA:  I have reviewed the data as listed Lab Results  Component Value Date   WBC 5.7 07/27/2015   HGB 17.3 07/27/2015   HCT 49.7 07/27/2015   MCV 100.8* 07/27/2015   PLT 146* 07/27/2015   No results for input(s): NA, K, CL, CO2, GLUCOSE, BUN, CREATININE, CALCIUM, GFRNONAA, GFRAA, PROT, ALBUMIN, AST, ALT, ALKPHOS, BILITOT, BILIDIR, IBILI in the last 8760 hours.   ASSESSMENT & PLAN:  Erythrocytosis Repeat hemoglobin 17 hematocrit 49; within  normal limits. 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 Patient not a smoker; question related to sleep apnea. Recommend sleep study.  I would recommend holding off phlebotomy at this time- as hematocrit is within normal limits. Recommend checking in approximately 4 months; follow-up with me in 8 months. Recommend phlebotomy if hematocrit greater than 52.  Fatigue Unlikely related to patient's hematocrit/elevated hemoglobin. Possible sleep apnea. Recommend CT study as discussed above.      Cammie Sickle, MD 08/29/2015 3:11 PM

## 2015-08-29 NOTE — Assessment & Plan Note (Signed)
Repeat hemoglobin 17 hematocrit 49; within normal limits. 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 Patient not a smoker; question related to sleep apnea. Recommend sleep study.  I would recommend holding off phlebotomy at this time- as hematocrit is within normal limits. Recommend checking in approximately 4 months; follow-up with me in 8 months. Recommend phlebotomy if hematocrit greater than 52.

## 2015-08-29 NOTE — Assessment & Plan Note (Addendum)
Unlikely related to patient's hematocrit/elevated hemoglobin. Possible sleep apnea. Recommend sleep study as discussed above. Defer to PCP.

## 2015-12-31 ENCOUNTER — Other Ambulatory Visit: Payer: Self-pay

## 2015-12-31 ENCOUNTER — Inpatient Hospital Stay: Payer: Medicare Other | Attending: Internal Medicine

## 2015-12-31 ENCOUNTER — Other Ambulatory Visit: Payer: Self-pay | Admitting: *Deleted

## 2015-12-31 ENCOUNTER — Inpatient Hospital Stay: Payer: Medicare Other

## 2015-12-31 DIAGNOSIS — G4730 Sleep apnea, unspecified: Secondary | ICD-10-CM | POA: Diagnosis not present

## 2015-12-31 DIAGNOSIS — D751 Secondary polycythemia: Secondary | ICD-10-CM | POA: Insufficient documentation

## 2015-12-31 LAB — HEMATOCRIT: HEMATOCRIT: 50 % (ref 40.0–52.0)

## 2015-12-31 LAB — HEMOGLOBIN: Hemoglobin: 17.5 g/dL (ref 13.0–18.0)

## 2015-12-31 LAB — URIC ACID: URIC ACID, SERUM: 7.3 mg/dL (ref 4.4–7.6)

## 2016-04-30 ENCOUNTER — Other Ambulatory Visit: Payer: Medicare Other

## 2016-04-30 ENCOUNTER — Inpatient Hospital Stay: Payer: Medicare Other | Attending: Internal Medicine

## 2016-04-30 ENCOUNTER — Inpatient Hospital Stay (HOSPITAL_BASED_OUTPATIENT_CLINIC_OR_DEPARTMENT_OTHER): Payer: Medicare Other | Admitting: Internal Medicine

## 2016-04-30 ENCOUNTER — Inpatient Hospital Stay: Payer: Medicare Other

## 2016-04-30 VITALS — BP 172/84 | HR 80 | Temp 97.2°F | Wt 261.2 lb

## 2016-04-30 DIAGNOSIS — Z880 Allergy status to penicillin: Secondary | ICD-10-CM | POA: Diagnosis not present

## 2016-04-30 DIAGNOSIS — E119 Type 2 diabetes mellitus without complications: Secondary | ICD-10-CM | POA: Diagnosis not present

## 2016-04-30 DIAGNOSIS — Z79899 Other long term (current) drug therapy: Secondary | ICD-10-CM | POA: Diagnosis not present

## 2016-04-30 DIAGNOSIS — E785 Hyperlipidemia, unspecified: Secondary | ICD-10-CM | POA: Insufficient documentation

## 2016-04-30 DIAGNOSIS — F1020 Alcohol dependence, uncomplicated: Secondary | ICD-10-CM | POA: Diagnosis not present

## 2016-04-30 DIAGNOSIS — G629 Polyneuropathy, unspecified: Secondary | ICD-10-CM | POA: Insufficient documentation

## 2016-04-30 DIAGNOSIS — G4733 Obstructive sleep apnea (adult) (pediatric): Secondary | ICD-10-CM | POA: Diagnosis not present

## 2016-04-30 DIAGNOSIS — Z885 Allergy status to narcotic agent status: Secondary | ICD-10-CM | POA: Diagnosis not present

## 2016-04-30 DIAGNOSIS — D751 Secondary polycythemia: Secondary | ICD-10-CM | POA: Diagnosis not present

## 2016-04-30 DIAGNOSIS — Z7984 Long term (current) use of oral hypoglycemic drugs: Secondary | ICD-10-CM | POA: Insufficient documentation

## 2016-04-30 DIAGNOSIS — E538 Deficiency of other specified B group vitamins: Secondary | ICD-10-CM | POA: Insufficient documentation

## 2016-04-30 DIAGNOSIS — I10 Essential (primary) hypertension: Secondary | ICD-10-CM | POA: Insufficient documentation

## 2016-04-30 LAB — HEMOGLOBIN: HEMOGLOBIN: 15.5 g/dL (ref 13.0–18.0)

## 2016-04-30 LAB — HEMATOCRIT: HEMATOCRIT: 44.4 % (ref 40.0–52.0)

## 2016-04-30 NOTE — Progress Notes (Signed)
Patient here today for follow up.   

## 2016-04-30 NOTE — Assessment & Plan Note (Addendum)
#   Today hematocrit 44'; patient has not needed any phlebotomies. 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 OSA; no sleep study]. Patient not a smoker.   # H&H in 6 months/possible phlebotomy; cbc/cmp/MD in 12 months/ possible phlebotomy.

## 2016-04-30 NOTE — Progress Notes (Signed)
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  male with above diagnosis of erythrocytosis likely secondary is here for follow-up.  Patient has not needed any phlebotomies the last many months. He is feeling well. States that his try to lose weight. Denies having sleep study.   ROS: A complete 10 point review of system is done which is negative except mentioned above in history of present illness  MEDICAL HISTORY:  Past Medical History:  Diagnosis Date  . B12 deficiency   . Diabetes mellitus without complication ([place])   . Erythrocytosis   . EtOH dependence ([place])   . Fatty liver   . Hyperlipidemia   . Hypertension   . Neuromuscular disorder ([place])    Peripheral Neuropathy    SURGICAL HISTORY: Past Surgical History:  Procedure Laterality Date  . COLONOSCOPY WITH PROPOFOL N/A 10/19/2014   Procedure: COLONOSCOPY WITH PROPOFOL;  Surgeon: Hulen Luster, MD;  Location: [facility] ENDOSCOPY;  Service: Gastroenterology;  Laterality: N/A;  . KNEE ARTHROSCOPY    . NECK SURGERY      SOCIAL HISTORY: Social History   Social History  . Marital status: Single    Spouse name: N/A  . Number of children: N/A  . Years of education: N/A   Occupational History  . Not on file.   Social History Main Topics  . Smoking status: Never Smoker  . Smokeless tobacco: Never Used  . Alcohol use 6.0 oz/week    10 Shots of liquor per week  . Drug use: No  . Sexual activity: Not on file   Other Topics Concern  . Not on file   Social History Narrative  . No narrative on file    FAMILY HISTORY: No family history on file.  ALLERGIES:  is allergic to codeine; eszopiclone; and  penicillins.  MEDICATIONS:  Current Outpatient Prescriptions  Medication Sig Dispense Refill  . cyanocobalamin 1000 MCG tablet Take 100 mcg by mouth daily.    Marland Kitchen lisinopril (PRINIVIL,ZESTRIL) 10 MG tablet Take 10 mg by mouth daily.    . metFORMIN (GLUCOPHAGE) 1000 MG tablet Take 1,000 mg by mouth 2 (two) times daily with a meal.    . OMEGA-3 FATTY ACIDS-VITAMIN E PO Take 1,000 mg by mouth.     No current facility-administered medications for this visit.       Marland Kitchen  PHYSICAL EXAMINATION:   Vitals:   04/30/16 1402  BP: (!) 172/84  Pulse: 80  Temp: 97.2 F (36.2 C)   Filed Weights   04/30/16 1402  Weight: 261 lb 4 oz (118.5 kg)    GENERAL: Well-nourished well-developed; Alert, no distress and comfortable.   Ruddy complexion.  EYES: no pallor or icterus OROPHARYNX: no thrush or ulceration; good dentition  NECK: supple, no masses felt LYMPH:  no palpable lymphadenopathy in the cervical, axillary or inguinal regions LUNGS: clear to auscultation and  No wheeze or crackles HEART/CVS: regular rate & rhythm and no murmurs; No lower extremity edema ABDOMEN: abdomen soft, non-tender and normal bowel sounds Musculoskeletal:no cyanosis of digits and no clubbing  PSYCH: alert & oriented x 3 with fluent speech NEURO: no focal motor/sensory deficits SKIN:  no rashes or significant lesions  LABORATORY  DATA:  I have reviewed the data as listed Lab Results  Component Value Date   WBC 5.7 07/27/2015   HGB 15.5 04/30/2016   HCT 44.4 04/30/2016   MCV 100.8 (H) 07/27/2015   PLT 146 (L) 07/27/2015   No results for input(s): NA, K, CL, CO2, GLUCOSE, BUN, CREATININE, CALCIUM, GFRNONAA, GFRAA, PROT, ALBUMIN, AST, ALT, ALKPHOS, BILITOT, BILIDIR, IBILI in the last 8760 hours.   ASSESSMENT & PLAN:  Erythrocytosis # Today hematocrit 44'; patient has not needed any phlebotomies. 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 OSA; no sleep study]. Patient not a  smoker.   # H&H in 6 months/possible phlebotomy; cbc/cmp/MD in 12 months/ possible phlebotomy.      Cammie Sickle, MD 04/30/2016 4:25 PM

## 2016-10-28 ENCOUNTER — Inpatient Hospital Stay: Payer: Medicare Other

## 2017-03-02 ENCOUNTER — Other Ambulatory Visit: Payer: Self-pay | Admitting: Surgery

## 2017-03-02 DIAGNOSIS — M7581 Other shoulder lesions, right shoulder: Secondary | ICD-10-CM

## 2017-03-11 ENCOUNTER — Ambulatory Visit
Admission: RE | Admit: 2017-03-11 | Discharge: 2017-03-11 | Disposition: A | Discharge status: Home / Self Care | Payer: Medicare Other | Source: Ambulatory Visit | Attending: Surgery | Admitting: Surgery

## 2017-03-11 DIAGNOSIS — M7581 Other shoulder lesions, right shoulder: Secondary | ICD-10-CM | POA: Diagnosis not present

## 2017-03-11 DIAGNOSIS — S43491A Other sprain of right shoulder joint, initial encounter: Secondary | ICD-10-CM | POA: Diagnosis not present

## 2017-03-11 DIAGNOSIS — X58XXXA Exposure to other specified factors, initial encounter: Secondary | ICD-10-CM | POA: Insufficient documentation

## 2017-03-20 DIAGNOSIS — S43431A Superior glenoid labrum lesion of right shoulder, initial encounter: Secondary | ICD-10-CM | POA: Insufficient documentation

## 2017-03-20 DIAGNOSIS — M67411 Ganglion, right shoulder: Secondary | ICD-10-CM | POA: Insufficient documentation

## 2017-05-01 ENCOUNTER — Other Ambulatory Visit: Payer: Medicare Other

## 2017-05-01 ENCOUNTER — Ambulatory Visit: Payer: Medicare Other | Admitting: Internal Medicine

## 2018-01-15 ENCOUNTER — Other Ambulatory Visit: Payer: Self-pay | Admitting: Sports Medicine

## 2018-01-15 DIAGNOSIS — G8929 Other chronic pain: Secondary | ICD-10-CM

## 2018-01-15 DIAGNOSIS — M5136 Other intervertebral disc degeneration, lumbar region: Secondary | ICD-10-CM

## 2018-01-15 DIAGNOSIS — Z85828 Personal history of other malignant neoplasm of skin: Secondary | ICD-10-CM

## 2018-01-15 DIAGNOSIS — M5442 Lumbago with sciatica, left side: Principal | ICD-10-CM

## 2018-01-15 DIAGNOSIS — M533 Sacrococcygeal disorders, not elsewhere classified: Secondary | ICD-10-CM

## 2018-01-15 DIAGNOSIS — M5441 Lumbago with sciatica, right side: Principal | ICD-10-CM

## 2018-01-15 HISTORY — DX: Personal history of other malignant neoplasm of skin: Z85.828

## 2018-01-25 DIAGNOSIS — Z86018 Personal history of other benign neoplasm: Secondary | ICD-10-CM

## 2018-01-25 HISTORY — DX: Personal history of other benign neoplasm: Z86.018

## 2018-02-02 ENCOUNTER — Ambulatory Visit
Admission: RE | Admit: 2018-02-02 | Discharge: 2018-02-02 | Disposition: A | Discharge status: Home / Self Care | Payer: Medicare Other | Source: Ambulatory Visit | Attending: Sports Medicine | Admitting: Sports Medicine

## 2018-02-02 DIAGNOSIS — M5442 Lumbago with sciatica, left side: Secondary | ICD-10-CM | POA: Diagnosis present

## 2018-02-02 DIAGNOSIS — M48061 Spinal stenosis, lumbar region without neurogenic claudication: Secondary | ICD-10-CM | POA: Insufficient documentation

## 2018-02-02 DIAGNOSIS — M533 Sacrococcygeal disorders, not elsewhere classified: Secondary | ICD-10-CM | POA: Diagnosis not present

## 2018-02-02 DIAGNOSIS — M5136 Other intervertebral disc degeneration, lumbar region: Secondary | ICD-10-CM

## 2018-02-02 DIAGNOSIS — G8929 Other chronic pain: Secondary | ICD-10-CM | POA: Diagnosis not present

## 2018-02-02 DIAGNOSIS — M5441 Lumbago with sciatica, right side: Secondary | ICD-10-CM | POA: Diagnosis present

## 2018-02-02 DIAGNOSIS — M47896 Other spondylosis, lumbar region: Secondary | ICD-10-CM | POA: Diagnosis not present

## 2018-02-17 DIAGNOSIS — C4492 Squamous cell carcinoma of skin, unspecified: Secondary | ICD-10-CM

## 2018-02-17 HISTORY — PX: SQUAMOUS CELL CARCINOMA EXCISION: SHX2433

## 2018-02-17 HISTORY — DX: Squamous cell carcinoma of skin, unspecified: C44.92

## 2019-04-15 ENCOUNTER — Other Ambulatory Visit: Payer: Self-pay | Admitting: Ophthalmology

## 2019-04-15 DIAGNOSIS — H4720 Unspecified optic atrophy: Secondary | ICD-10-CM

## 2019-04-25 ENCOUNTER — Ambulatory Visit
Admission: RE | Admit: 2019-04-25 | Discharge: 2019-04-25 | Disposition: A | Discharge status: Home / Self Care | Payer: Medicare Other | Source: Ambulatory Visit | Attending: Ophthalmology | Admitting: Ophthalmology

## 2019-04-25 ENCOUNTER — Other Ambulatory Visit: Payer: Self-pay

## 2019-04-25 DIAGNOSIS — H4720 Unspecified optic atrophy: Secondary | ICD-10-CM | POA: Insufficient documentation

## 2019-04-25 MED ORDER — GADOBUTROL 1 MMOL/ML IV SOLN
10.0000 mL | Freq: Once | INTRAVENOUS | Status: AC | PRN
  Administered 2019-04-25: 10 mL via INTRAVENOUS

## 2019-06-04 ENCOUNTER — Ambulatory Visit: Payer: Medicare Other | Attending: Internal Medicine

## 2019-06-04 DIAGNOSIS — Z23 Encounter for immunization: Secondary | ICD-10-CM

## 2019-06-04 NOTE — Progress Notes (Signed)
   Covid-19 Vaccination Clinic  Name:  Johnny Beck    MRN: [id number] DOB: 07-08-1955  06/04/2019  [name] was observed post Covid-19 immunization for 15 minutes without incident. He was provided with Vaccine Information Sheet and instruction to access the V-Safe system.   [name] was instructed to call 911 with any severe reactions post vaccine: Marland Kitchen Difficulty breathing  . Swelling of face and throat  . A fast heartbeat  . A bad rash all over body  . Dizziness and weakness   Immunizations Administered    Name Date Dose VIS Date Route   Moderna COVID-19 Vaccine 06/04/2019  8:08 AM 0.5 mL 02/15/2019 Intramuscular   Manufacturer: Moderna   Lot: [id number]   [id number]

## 2019-07-05 ENCOUNTER — Ambulatory Visit: Payer: Medicare Other | Attending: Internal Medicine

## 2019-07-05 DIAGNOSIS — Z23 Encounter for immunization: Secondary | ICD-10-CM

## 2019-07-07 ENCOUNTER — Ambulatory Visit (INDEPENDENT_AMBULATORY_CARE_PROVIDER_SITE_OTHER): Payer: Medicare Other | Admitting: Dermatology

## 2019-07-07 ENCOUNTER — Other Ambulatory Visit: Payer: Self-pay

## 2019-07-07 DIAGNOSIS — Z85828 Personal history of other malignant neoplasm of skin: Secondary | ICD-10-CM

## 2019-07-07 DIAGNOSIS — Z86018 Personal history of other benign neoplasm: Secondary | ICD-10-CM | POA: Diagnosis not present

## 2019-07-07 DIAGNOSIS — B353 Tinea pedis: Secondary | ICD-10-CM | POA: Diagnosis not present

## 2019-07-07 DIAGNOSIS — B351 Tinea unguium: Secondary | ICD-10-CM

## 2019-07-07 DIAGNOSIS — Z1283 Encounter for screening for malignant neoplasm of skin: Secondary | ICD-10-CM

## 2019-07-07 DIAGNOSIS — L814 Other melanin hyperpigmentation: Secondary | ICD-10-CM

## 2019-07-07 DIAGNOSIS — D229 Melanocytic nevi, unspecified: Secondary | ICD-10-CM

## 2019-07-07 DIAGNOSIS — D1800 Hemangioma unspecified site: Secondary | ICD-10-CM

## 2019-07-07 DIAGNOSIS — L570 Actinic keratosis: Secondary | ICD-10-CM | POA: Diagnosis not present

## 2019-07-07 DIAGNOSIS — L578 Other skin changes due to chronic exposure to nonionizing radiation: Secondary | ICD-10-CM

## 2019-07-07 DIAGNOSIS — L821 Other seborrheic keratosis: Secondary | ICD-10-CM

## 2019-07-07 NOTE — Patient Instructions (Addendum)
Recommend daily broad spectrum sunscreen SPF 30+ to sun-exposed areas, reapply every 2 hours as needed. Call for new or changing lesions.  Recommend taking Heliocare sun protection supplement daily in sunny weather for additional sun protection. For maximum protection on the sunniest days, you can take up to 2 capsules of regular Heliocare OR take 1 capsule of Heliocare Ultra. For prolonged exposure (such as a full day in the sun), you can repeat your dose of the supplement 4 hours after your first dose. Heliocare can be purchased at [facility] or at [email or web address].   Recommend Nicotinamide 500mg  twice per day to lower risk of non-melanoma skin cancer by approximately 25%.   Cryotherapy Aftercare  . Wash gently with soap and water everyday.   Marland Kitchen Apply Vaseline and Band-Aid daily until healed.

## 2019-07-07 NOTE — Progress Notes (Signed)
   Follow-Up Visit   Subjective  Johnny Beck is a 64 y.o. male who presents for the following: Follow-up and Annual Exam.  Patient here for TBSE with history of multiple SCC, severely dysplastic nevus.  Patient here to follow up for tinea pedis and onychomycosis of the feet and toenails. He declined treatment with terbinafine previously and is only using an over the counter anti-fungal. He advises they have improved.   The following portions of the chart were reviewed this encounter and updated as appropriate:  Tobacco  Allergies  Meds  Problems  Med Hx  Surg Hx  Fam Hx     Review of Systems:  No other skin or systemic complaints except as noted in HPI or Assessment and Plan.  Objective  Well appearing patient in no apparent distress; mood and affect are within normal limits.  A full examination was performed including scalp, head, eyes, ears, nose, lips, neck, chest, axillae, abdomen, back, buttocks, bilateral upper extremities, bilateral lower extremities, hands, feet, fingers, toes, fingernails, and toenails. All findings within normal limits unless otherwise noted below.  Objective  Left Ear x 1, vertex scalp x 3 (4): Erythematous thin papules/macules with gritty scale.   Objective  Right Neck, R upper back: Well healed scar with no evidence of recurrence, no lymphadenopathy.   Objective  Left lobe: Scar with no evidence of recurrence.   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   History of dysplastic nevus Left lobe  No evidence of recurrence, call clinic for new or changing lesions.   Tinea pedis of both feet toenails  Restart antifungal cream prn   Lentigines - Scattered tan macules - Discussed due to sun exposure - Benign, observe - Call for any changes  Seborrheic Keratoses - Stuck-on, waxy, tan-brown papules and plaques  - Discussed benign etiology and prognosis. - Observe - Call for any changes  Melanocytic Nevi - Tan-brown and/or pink-flesh-colored symmetric macules and papules - Benign appearing on exam today - Observation - Call clinic for new or changing moles - Recommend daily use of broad spectrum spf 30+ sunscreen to sun-exposed areas.   Hemangiomas - Red papules - Discussed benign nature - Observe - Call for any changes  Actinic Damage - diffuse scaly erythematous macules with underlying dyspigmentation - Recommend daily broad spectrum sunscreen SPF 30+ to sun-exposed areas, reapply every 2 hours as needed.  - Call for new or changing lesions.  Skin cancer screening performed today.  Return in about 6 months (around 01/06/2020) for TBSE.  Graciella Belton, RMA, am acting as scribe for Forest Gleason, MD .  Documentation: I have reviewed the above documentation for accuracy and completeness, and I agree with the above.  Forest Gleason, MD

## 2019-07-23 ENCOUNTER — Encounter: Payer: Self-pay | Admitting: Dermatology

## 2020-01-05 ENCOUNTER — Ambulatory Visit: Payer: Medicare Other | Admitting: Dermatology

## 2020-01-05 ENCOUNTER — Other Ambulatory Visit: Payer: Self-pay | Admitting: Dermatology

## 2020-01-05 ENCOUNTER — Other Ambulatory Visit: Payer: Self-pay

## 2020-01-05 ENCOUNTER — Encounter: Payer: Self-pay | Admitting: Dermatology

## 2020-01-05 DIAGNOSIS — Z86018 Personal history of other benign neoplasm: Secondary | ICD-10-CM

## 2020-01-05 DIAGNOSIS — L814 Other melanin hyperpigmentation: Secondary | ICD-10-CM

## 2020-01-05 DIAGNOSIS — L720 Epidermal cyst: Secondary | ICD-10-CM | POA: Diagnosis not present

## 2020-01-05 DIAGNOSIS — L570 Actinic keratosis: Secondary | ICD-10-CM

## 2020-01-05 DIAGNOSIS — T148XXA Other injury of unspecified body region, initial encounter: Secondary | ICD-10-CM

## 2020-01-05 DIAGNOSIS — Z85828 Personal history of other malignant neoplasm of skin: Secondary | ICD-10-CM

## 2020-01-05 DIAGNOSIS — L578 Other skin changes due to chronic exposure to nonionizing radiation: Secondary | ICD-10-CM

## 2020-01-05 DIAGNOSIS — Z1283 Encounter for screening for malignant neoplasm of skin: Secondary | ICD-10-CM

## 2020-01-05 DIAGNOSIS — D1800 Hemangioma unspecified site: Secondary | ICD-10-CM

## 2020-01-05 DIAGNOSIS — L821 Other seborrheic keratosis: Secondary | ICD-10-CM

## 2020-01-05 DIAGNOSIS — B353 Tinea pedis: Secondary | ICD-10-CM

## 2020-01-05 DIAGNOSIS — D229 Melanocytic nevi, unspecified: Secondary | ICD-10-CM

## 2020-01-05 DIAGNOSIS — S91105A Unspecified open wound of left lesser toe(s) without damage to nail, initial encounter: Secondary | ICD-10-CM

## 2020-01-05 MED ORDER — MUPIROCIN 2 % EX OINT: 1.0000 "application " | TOPICAL_OINTMENT | Freq: Two times a day (BID) | CUTANEOUS | 0 refills | Status: AC

## 2020-01-05 NOTE — Progress Notes (Signed)
Follow-Up Visit   Subjective  Johnny Beck is a 64 y.o. male who presents for the following: TBSE (No concerns today).  Patient here for full body skin exam and skin cancer screening. 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  Objective  Well appearing patient in no apparent distress; mood and affect are within normal limits.  A full examination was performed including scalp, head, eyes, ears, nose, lips, neck, chest, axillae, abdomen, back, buttocks, bilateral upper extremities, bilateral lower extremities, hands, feet, fingers, toes, fingernails, and toenails. All findings within normal limits unless otherwise noted below.  Objective  Left Cavum, Right Forearm x 2 (2), Vertex Scalp x 7 (7): Erythematous thin papules/macules with gritty scale.   Objective  Left 2nd Toe dorsum: Open wound with purulent exudate  Objective  Right Upper Back: Subcutaneous nodule.   Objective  Right Foot - Anterior: Scaling and maceration web spaces and over distal and lateral soles.    Assessment & Plan  AK (actinic keratosis) (10) Right Forearm x 2 (2); Left Cavum; Vertex Scalp x 7 (7)  Cryotherapy today Prior to procedure, discussed risks of blister formation, small wound, skin dyspigmentation, or rare scar following cryotherapy.    Destruction of lesion - Left Cavum, Right Forearm x 2, Vertex Scalp x 7  Destruction method: cryotherapy   Informed consent: discussed and consent obtained   Lesion destroyed using liquid nitrogen: Yes   Outcome: patient tolerated procedure well with no complications     Post-procedure details: wound care instructions given    Open wound Left 2nd Toe dorsum  Will send in bacterial culture today  Start Mupirocin ointment to affected area as needed.  WOUND CULTURE - Left 2nd Toe dorsum  mupirocin ointment (BACTROBAN) 2 % - Left 2nd Toe dorsum  Epidermal cyst Right Upper Back  Benign-appearing. Recommend observation. Can excise if becomes bothersome.   Tinea pedis of both feet Right Foot - Anterior  Restart OTC antifungal cream twice a day for 3-4 weeks until clear, Use PRN  Lentigines - Scattered tan macules - Discussed due to sun exposure - Benign, observe - Call for any changes  Seborrheic Keratoses - Stuck-on, waxy, tan-brown papules and plaques  - Discussed benign etiology and prognosis. - Observe - Call for any changes  Melanocytic Nevi - Tan-brown and/or pink-flesh-colored symmetric macules and papules - Benign appearing on exam today - Observation - Call clinic for new or changing moles - Recommend daily use of broad spectrum spf 30+ sunscreen to sun-exposed areas.   Hemangiomas - Red papules - Discussed benign nature - Observe - Call for any changes  Actinic Damage - diffuse scaly erythematous macules with underlying dyspigmentation - Recommend daily broad spectrum sunscreen SPF 30+ to sun-exposed areas, reapply every 2 hours as needed.  - Call for new or changing lesions.  Skin cancer screening performed today.  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  - Call if any new or changing lesions are noted between office visits  History of Dysplastic Nevus - Severe, at left ear - No evidence of recurrence today] - No cervical or axillary lymphadenopathy - Recommend regular full  body skin exams - Recommend daily broad spectrum sunscreen SPF 30+ to sun-exposed areas, reapply every 2 hours as needed.  - Call if any new or changing lesions are noted between office visits  Return in about 6 months (around 07/05/2020) for TBSE.  I, Donzetta Kohut, CMA, am acting as scribe for Forest Gleason, MD .  Documentation: I have reviewed the above documentation for accuracy and completeness, and I agree with the above.  Forest Gleason, MD

## 2020-01-05 NOTE — Patient Instructions (Addendum)
Recommend daily broad spectrum sunscreen SPF 30+ to sun-exposed areas, reapply every 2 hours as needed. Call for new or changing lesions.  Recommend Nicotinamide 500mg  twice per day to lower risk of non-melanoma skin cancer by approximately 25%.   Recommend taking Heliocare sun protection supplement daily in sunny weather for additional sun protection. For maximum protection on the sunniest days, you can take up to 2 capsules of regular Heliocare OR take 1 capsule of Heliocare Ultra. For prolonged exposure (such as a full day in the sun), you can repeat your dose of the supplement 4 hours after your first dose. Heliocare can be purchased at [facility] or at [email or web address].

## 2020-01-12 LAB — WOUND CULTURE: Organism ID, Bacteria: NONE SEEN

## 2020-01-17 ENCOUNTER — Telehealth: Payer: Self-pay

## 2020-01-17 NOTE — Progress Notes (Signed)
Culture from wound on toe grew   Mixed skin flora including multiple gram negative rods.  Heavy growth  Recommend continuing mupirocin ointment twice a day and add gentamicin ointment (for pseudomonas bacteria coverage) twice a day until healed  MAs please call.

## 2020-01-17 NOTE — Telephone Encounter (Signed)
Patient advised culture results. He advises that area is healed up and does not need gentamicin sent in. JS

## 2020-01-17 NOTE — Telephone Encounter (Signed)
-----   Message from [place], MD sent at 01/17/2020 11:26 AM EDT ----- Culture from wound on toe grew   Mixed skin flora including multiple gram negative rods.  Heavy growth  Recommend continuing mupirocin ointment twice a day and add gentamicin ointment (for pseudomonas bacteria coverage) twice a day until healed  MAs please call.

## 2020-05-16 ENCOUNTER — Other Ambulatory Visit: Payer: Self-pay | Admitting: Gastroenterology

## 2020-05-16 DIAGNOSIS — K219 Gastro-esophageal reflux disease without esophagitis: Secondary | ICD-10-CM

## 2020-05-16 DIAGNOSIS — R1314 Dysphagia, pharyngoesophageal phase: Secondary | ICD-10-CM

## 2020-05-23 ENCOUNTER — Other Ambulatory Visit: Payer: Self-pay | Admitting: Gastroenterology

## 2020-05-23 ENCOUNTER — Other Ambulatory Visit: Payer: Self-pay

## 2020-05-23 ENCOUNTER — Ambulatory Visit
Admission: RE | Admit: 2020-05-23 | Discharge: 2020-05-23 | Disposition: A | Discharge status: Home / Self Care | Payer: Medicare Other | Source: Ambulatory Visit | Attending: Gastroenterology | Admitting: Gastroenterology

## 2020-05-23 DIAGNOSIS — K219 Gastro-esophageal reflux disease without esophagitis: Secondary | ICD-10-CM

## 2020-05-23 DIAGNOSIS — R1314 Dysphagia, pharyngoesophageal phase: Secondary | ICD-10-CM

## 2020-06-04 DIAGNOSIS — E1142 Type 2 diabetes mellitus with diabetic polyneuropathy: Secondary | ICD-10-CM | POA: Insufficient documentation

## 2020-07-12 ENCOUNTER — Ambulatory Visit: Payer: Medicare Other | Admitting: Dermatology

## 2020-07-12 ENCOUNTER — Encounter: Payer: Self-pay | Admitting: Dermatology

## 2020-07-12 ENCOUNTER — Other Ambulatory Visit: Payer: Self-pay

## 2020-07-12 DIAGNOSIS — D229 Melanocytic nevi, unspecified: Secondary | ICD-10-CM

## 2020-07-12 DIAGNOSIS — L821 Other seborrheic keratosis: Secondary | ICD-10-CM

## 2020-07-12 DIAGNOSIS — L578 Other skin changes due to chronic exposure to nonionizing radiation: Secondary | ICD-10-CM

## 2020-07-12 DIAGNOSIS — L590 Erythema ab igne [dermatitis ab igne]: Secondary | ICD-10-CM | POA: Diagnosis not present

## 2020-07-12 DIAGNOSIS — L814 Other melanin hyperpigmentation: Secondary | ICD-10-CM

## 2020-07-12 DIAGNOSIS — Z85828 Personal history of other malignant neoplasm of skin: Secondary | ICD-10-CM

## 2020-07-12 DIAGNOSIS — Z86018 Personal history of other benign neoplasm: Secondary | ICD-10-CM | POA: Diagnosis not present

## 2020-07-12 DIAGNOSIS — Z1283 Encounter for screening for malignant neoplasm of skin: Secondary | ICD-10-CM | POA: Diagnosis not present

## 2020-07-12 DIAGNOSIS — D1800 Hemangioma unspecified site: Secondary | ICD-10-CM

## 2020-07-12 NOTE — Patient Instructions (Addendum)

## 2020-07-12 NOTE — Progress Notes (Signed)
   Follow-Up Visit   Subjective  Johnny Beck is a 65 y.o. male who presents for the following: FBSE (Patient here for full body skin exam and skin cancer screening. Patient with a hx of dysplastic nevi and SCC. He is not aware of any new or changing spots. ).   The following portions of the chart were reviewed this encounter and updated as appropriate:   Tobacco  Allergies  Meds  Problems  Med Hx  Surg Hx  Fam Hx      Review of Systems:  No other skin or systemic complaints except as noted in HPI or Assessment and Plan.  Objective  Well appearing patient in no apparent distress; mood and affect are within normal limits.  A full examination was performed including scalp, head, eyes, ears, nose, lips, neck, chest, axillae, abdomen, back, buttocks, bilateral upper extremities, bilateral lower extremities, hands, feet, fingers, toes, fingernails, and toenails. All findings within normal limits unless otherwise noted below.  Objective  Mid Back: Net-like red to violaceous patches   Assessment & Plan  Erythema ab igne Mid Back  Benign. Recommend avoiding heating pad use or turn down heat significantly to avoid continued skin damage.    Lentigines - Scattered tan macules - Due to sun exposure - Benign-appering, observe - Recommend daily broad spectrum sunscreen SPF 30+ to sun-exposed areas, reapply every 2 hours as needed. - Call for any changes  Seborrheic Keratoses - Stuck-on, waxy, tan-brown papules and/or plaques  - Benign-appearing - Discussed benign etiology and prognosis. - Observe - Call for any changes  Melanocytic Nevi - Tan-brown and/or pink-flesh-colored symmetric macules and papules - Benign appearing on exam today - Observation - Call clinic for new or changing moles - Recommend daily use of broad spectrum spf 30+ sunscreen to sun-exposed areas.   Hemangiomas - Red papules - Discussed benign nature - Observe - Call for any changes  Actinic  Damage - Chronic condition, secondary to cumulative UV/sun exposure - diffuse scaly erythematous macules with underlying dyspigmentation - Recommend daily broad spectrum sunscreen SPF 30+ to sun-exposed areas, reapply every 2 hours as needed.  - Staying in the shade or wearing long sleeves, sun glasses (UVA+UVB protection) and wide brim hats (4-inch brim around the entire circumference of the hat) are also recommended for sun protection.  - Call for new or changing lesions.  Skin cancer screening performed today.  History of Dysplastic Nevi - No evidence of recurrence today at left earlobe, severe - Recommend regular full body skin exams - Recommend daily broad spectrum sunscreen SPF 30+ to sun-exposed areas, reapply every 2 hours as needed.  - Call if any new or changing lesions are noted between office visits  History of Squamous Cell Carcinoma of the Skin - No evidence of recurrence today at right neck, right upper back - No lymphadenopathy - Recommend regular full body skin exams - Recommend daily broad spectrum sunscreen SPF 30+ to sun-exposed areas, reapply every 2 hours as needed.  - Call if any new or changing lesions are noted between office visits  Return in about 6 months (around 01/11/2021) for TBSE.  Graciella Belton, RMA, am acting as scribe for Forest Gleason, MD .  Documentation: I have reviewed the above documentation for accuracy and completeness, and I agree with the above.  Forest Gleason, MD

## 2020-08-18 ENCOUNTER — Encounter (HOSPITAL_COMMUNITY): Payer: Self-pay | Admitting: *Deleted

## 2020-08-18 ENCOUNTER — Emergency Department (HOSPITAL_COMMUNITY)
Admission: EM | Admit: 2020-08-18 | Discharge: 2020-08-18 | Disposition: A | Discharge status: Home / Self Care | Payer: Medicare Other | Attending: Emergency Medicine | Admitting: Emergency Medicine

## 2020-08-18 DIAGNOSIS — Z85828 Personal history of other malignant neoplasm of skin: Secondary | ICD-10-CM | POA: Diagnosis not present

## 2020-08-18 DIAGNOSIS — E119 Type 2 diabetes mellitus without complications: Secondary | ICD-10-CM | POA: Diagnosis not present

## 2020-08-18 DIAGNOSIS — S81811A Laceration without foreign body, right lower leg, initial encounter: Secondary | ICD-10-CM | POA: Insufficient documentation

## 2020-08-18 DIAGNOSIS — Z79899 Other long term (current) drug therapy: Secondary | ICD-10-CM | POA: Insufficient documentation

## 2020-08-18 DIAGNOSIS — Z7984 Long term (current) use of oral hypoglycemic drugs: Secondary | ICD-10-CM | POA: Diagnosis not present

## 2020-08-18 DIAGNOSIS — I10 Essential (primary) hypertension: Secondary | ICD-10-CM | POA: Diagnosis not present

## 2020-08-18 DIAGNOSIS — W268XXA Contact with other sharp object(s), not elsewhere classified, initial encounter: Secondary | ICD-10-CM | POA: Insufficient documentation

## 2020-08-18 DIAGNOSIS — S8991XA Unspecified injury of right lower leg, initial encounter: Secondary | ICD-10-CM | POA: Diagnosis present

## 2020-08-18 MED ORDER — POVIDONE-IODINE 10 % EX SOLN
CUTANEOUS | Status: AC
  Filled 2020-08-18: qty 15

## 2020-08-18 MED ORDER — SULFAMETHOXAZOLE-TRIMETHOPRIM 800-160 MG PO TABS: 1.0000 | ORAL_TABLET | Freq: Two times a day (BID) | ORAL | 0 refills | Status: AC

## 2020-08-18 MED ORDER — LIDOCAINE HCL (PF) 2 % IJ SOLN
INTRAMUSCULAR | Status: AC
  Filled 2020-08-18: qty 20

## 2020-08-18 MED ORDER — LIDOCAINE HCL (PF) 2 % IJ SOLN
INTRAMUSCULAR | Status: AC
  Filled 2020-08-18: qty 10

## 2020-08-18 MED ORDER — LIDOCAINE HCL (PF) 2 % IJ SOLN
10.0000 mL | Freq: Once | INTRAMUSCULAR | Status: AC
  Administered 2020-08-18: 10 mL via INTRADERMAL

## 2020-08-18 NOTE — Discharge Instructions (Signed)
Clean laceration twice a day with soap and water.  Sutures need to come out in 12 to 14 days.  Return sooner if any problems

## 2020-08-18 NOTE — ED Triage Notes (Signed)
Laceration to left lower leg, bandaged on arrival

## 2020-08-18 NOTE — ED Provider Notes (Signed)
[facility] EMERGENCY DEPARTMENT Provider Note   CSN: [id number] Arrival date & time: 08/18/20  1439     History Chief Complaint  Patient presents with  . Laceration    Johnny Beck is a 65 y.o. male.  Patient accidentally cut his right lower calf couple hours ago while doing some work..  Patient complains of pain in that area  The history is provided by the patient and medical records. No language interpreter was used.  Laceration Location: Right calf. Length:  10 cm Depth:  Through underlying tissue Quality: straight   Bleeding: venous   Laceration mechanism:  Metal edge Associated symptoms: no rash        Past Medical History:  Diagnosis Date  . B12 deficiency   . Diabetes mellitus without complication ([place])   . Erythrocytosis   . EtOH dependence ([place])   . Fatty liver   . Hx of dysplastic nevus 01/25/2018   L earlobe, severe atypia  . Hx of squamous cell carcinoma of skin 01/2018   R upper back, R neck  . Hyperlipidemia   . Hypertension   . Neuromuscular disorder [facility])    Peripheral Neuropathy    Patient Active Problem List   Diagnosis Date Noted  . Erythrocytosis 08/29/2015  . Fatigue 08/29/2015    Past Surgical History:  Procedure Laterality Date  . COLONOSCOPY WITH PROPOFOL N/A 10/19/2014   Procedure: COLONOSCOPY WITH PROPOFOL;  Surgeon: Hulen Luster, MD;  Location: [facility] ENDOSCOPY;  Service: Gastroenterology;  Laterality: N/A;  . KNEE ARTHROSCOPY    . NECK SURGERY    . SQUAMOUS CELL CARCINOMA EXCISION Right 02/17/2018   neck       No family history on file.  Social History   Tobacco Use  . Smoking status: Never Smoker  . Smokeless tobacco: Never Used  Substance Use Topics  . Alcohol use: Yes    Alcohol/week: 10.0 standard drinks    Types: 10 Shots of liquor per week  . Drug use: No    Home Medications Prior to Admission medications   Medication Sig Start Date End Date Taking? Authorizing Provider  sulfamethoxazole-trimethoprim  (BACTRIM DS) 800-160 MG tablet Take 1 tablet by mouth 2 (two) times daily for 5 days. 08/18/20 08/23/20 Yes Milton Ferguson, MD  cyanocobalamin 1000 MCG tablet Take 100 mcg by mouth daily.    [provider]  lisinopril (PRINIVIL,ZESTRIL) 10 MG tablet Take 10 mg by mouth daily.    [provider]  metFORMIN (GLUCOPHAGE) 1000 MG tablet Take 1,000 mg by mouth 2 (two) times daily with a meal.    [provider]  mupirocin ointment (BACTROBAN) 2 % Apply 1 application topically 2 (two) times daily. 01/05/20   Moye, [place], MD  OMEGA-3 FATTY ACIDS-VITAMIN E PO Take 1,000 mg by mouth.    [provider]    Allergies    Codeine, Eszopiclone, and Penicillins  Review of Systems   Review of Systems  Constitutional: Negative for appetite change and fatigue.  HENT: Negative for congestion, ear discharge and sinus pressure.   Eyes: Negative for discharge.  Respiratory: Negative for cough.   Cardiovascular: Negative for chest pain.  Gastrointestinal: Negative for abdominal pain and diarrhea.  Genitourinary: Negative for frequency and hematuria.  Musculoskeletal: Negative for back pain.       Laceration left leg  Skin: Negative for rash.  Neurological: Negative for seizures and headaches.  Psychiatric/Behavioral: Negative for hallucinations.    Physical Exam Updated Vital Signs BP (!) 136/103 (  BP Location: Right Arm)   Pulse 91   Temp 98.5 F (36.9 C) (Oral)   Resp 18   Ht 6\' 2"  (1.88 m)   Wt 105.7 kg   SpO2 96%   BMI 29.92 kg/m   Physical Exam Vitals and nursing note reviewed.  Constitutional:      Appearance: He is well-developed.  HENT:     Head: Normocephalic.     Nose: Nose normal.  Eyes:     Conjunctiva/sclera: Conjunctivae normal.  Neck:     Trachea: No tracheal deviation.  Cardiovascular:     Rate and Rhythm: Normal rate.  Pulmonary:     Effort: Pulmonary effort is normal.  Musculoskeletal:        General: Normal range of motion.      Cervical back: Normal range of motion.     Comments: Patient with 10 cm medial depth laceration to right posterior calf  Skin:    General: Skin is warm.     Capillary Refill: Capillary refill takes less than 2 seconds.  Neurological:     Mental Status: He is oriented to person, place, and time.     Comments: Normal pulses in legs and feet     ED Results / Procedures / Treatments   Labs (all labs ordered are listed, but only abnormal results are displayed) Labs Reviewed - No data to display  EKG None  Radiology No results found.  Procedures .Marland KitchenLaceration Repair  Date/Time: 08/18/2020 4:58 PM Performed by: Milton Ferguson, MD Authorized by: Milton Ferguson, MD   Comments:     Patient has a 10 cm laceration to his right calf that goes into the muscle.  The area was anesthetized with 2% lidocaine without epi.  It was cleaned thoroughly with Betadine and sterile water.  Patient had 3 4-0 Vicryl sutures used to close some of the lacerated muscle.  He then had 4 nylon 4-0 sutures used to close the laceration along with 10 staples.  Patient tolerated the procedure well     Medications Ordered in ED Medications  lidocaine HCl (PF) (XYLOCAINE) 2 % injection 10 mL (has no administration in time range)  lidocaine HCl (PF) (XYLOCAINE) 2 % injection (has no administration in time range)  povidone-iodine (BETADINE) 10 % external solution (has no administration in time range)  lidocaine HCl (PF) (XYLOCAINE) 2 % injection (has no administration in time range)    ED Course  I have reviewed the triage vital signs and the nursing notes.  Pertinent labs & imaging results that were available during my care of the patient were reviewed by me and considered in my medical decision making (see chart for details). Patient with a 10 cm laceration to his right calf.   MDM Rules/Calculators/A&P                         Patient with a 10 cm laceration that is mostly superficial right calf.  Area was  closed without problems and he was given some Bactrim to take to prevent infection.  He will follow-up in 12 to 14 days have sutures out  Final Clinical Impression(s) / ED Diagnoses Final diagnoses:  Laceration of right lower extremity, initial encounter    Rx / DC Orders ED Discharge Orders         Ordered    sulfamethoxazole-trimethoprim (BACTRIM DS) 800-160 MG tablet  2 times daily        08/18/20 1657  Milton Ferguson, MD 08/20/20 1100

## 2020-09-03 ENCOUNTER — Encounter (HOSPITAL_COMMUNITY): Payer: Self-pay | Admitting: Emergency Medicine

## 2020-09-03 ENCOUNTER — Emergency Department (HOSPITAL_COMMUNITY)
Admission: EM | Admit: 2020-09-03 | Discharge: 2020-09-03 | Disposition: A | Discharge status: Home / Self Care | Payer: Medicare Other | Attending: Emergency Medicine | Admitting: Emergency Medicine

## 2020-09-03 ENCOUNTER — Other Ambulatory Visit: Payer: Self-pay

## 2020-09-03 DIAGNOSIS — I10 Essential (primary) hypertension: Secondary | ICD-10-CM | POA: Insufficient documentation

## 2020-09-03 DIAGNOSIS — X58XXXD Exposure to other specified factors, subsequent encounter: Secondary | ICD-10-CM | POA: Insufficient documentation

## 2020-09-03 DIAGNOSIS — Z7984 Long term (current) use of oral hypoglycemic drugs: Secondary | ICD-10-CM | POA: Diagnosis not present

## 2020-09-03 DIAGNOSIS — Z79899 Other long term (current) drug therapy: Secondary | ICD-10-CM | POA: Diagnosis not present

## 2020-09-03 DIAGNOSIS — Z4800 Encounter for change or removal of nonsurgical wound dressing: Secondary | ICD-10-CM | POA: Diagnosis not present

## 2020-09-03 DIAGNOSIS — S81811D Laceration without foreign body, right lower leg, subsequent encounter: Secondary | ICD-10-CM | POA: Insufficient documentation

## 2020-09-03 DIAGNOSIS — E119 Type 2 diabetes mellitus without complications: Secondary | ICD-10-CM | POA: Insufficient documentation

## 2020-09-03 DIAGNOSIS — Z5189 Encounter for other specified aftercare: Secondary | ICD-10-CM

## 2020-09-03 DIAGNOSIS — Z4802 Encounter for removal of sutures: Secondary | ICD-10-CM | POA: Insufficient documentation

## 2020-09-03 DIAGNOSIS — S8991XD Unspecified injury of right lower leg, subsequent encounter: Secondary | ICD-10-CM | POA: Diagnosis present

## 2020-09-03 MED ORDER — DOXYCYCLINE HYCLATE 100 MG PO CAPS: 100.0000 mg | ORAL_CAPSULE | Freq: Two times a day (BID) | ORAL | 0 refills | Status: AC

## 2020-09-03 NOTE — Discharge Instructions (Addendum)
Gently clean the wound with soap and water. Make sure to pat dry the wound before covering it with any dressing. You can use topical antibiotic ointment and bandage. Ice and elevate for pain relief.   You can take Tylenol or Ibuprofen as directed for pain. You can alternate Tylenol and Ibuprofen every 4 hours for additional pain relief.   Take antibiotics as directed. Please take all of your antibiotics until finished.  This antibiotic can sometimes cause a rash if you are outside.  Please protect yourself appropriately if you are outside while taking this medication.  Return to the Emergency Department, your primary care doctor, or the [facility] in 5-7 days for suture removal.   Monitor closely for any signs of infection. Return to the Emergency Department for any worsening redness/swelling of the area that begins to spread, drainage from the site, worsening pain, fever or any other worsening or concerning symptoms.

## 2020-09-03 NOTE — ED Provider Notes (Signed)
[facility] EMERGENCY DEPARTMENT Provider Note   CSN: [id number] Arrival date & time: 09/03/20  1200     History Chief Complaint  Patient presents with   Suture / Staple Removal    Johnny Beck is a 65 y.o. male who presents for evaluation of suture removal.  Patient reports that on 08/18/2020, he was seen here for a laceration to his right calf.  He had staples and sutures placed at that time.  He states he was told to follow-up in about 2 weeks to get the sutures removed.  He states that the area has been draining some.  He states sometimes it is a yellowish fluid sometimes it is more clear.  He states it is gotten slightly red.  He has not had any pain associated with it no fevers.  The history is provided by the patient.      Past Medical History:  Diagnosis Date   B12 deficiency    Diabetes mellitus without complication ([place])    Erythrocytosis    EtOH dependence ([place])    Fatty liver    Hx of dysplastic nevus 01/25/2018   L earlobe, severe atypia   Hx of squamous cell carcinoma of skin 01/2018   R upper back, R neck   Hyperlipidemia    Hypertension    Neuromuscular disorder [facility])    Peripheral Neuropathy    Patient Active Problem List   Diagnosis Date Noted   Erythrocytosis 08/29/2015   Fatigue 08/29/2015    Past Surgical History:  Procedure Laterality Date   COLONOSCOPY WITH PROPOFOL N/A 10/19/2014   Procedure: COLONOSCOPY WITH PROPOFOL;  Surgeon: Hulen Luster, MD;  Location: [facility] ENDOSCOPY;  Service: Gastroenterology;  Laterality: N/A;   KNEE ARTHROSCOPY     NECK SURGERY     SQUAMOUS CELL CARCINOMA EXCISION Right 02/17/2018   neck       History reviewed. No pertinent family history.  Social History   Tobacco Use   Smoking status: Never   Smokeless tobacco: Never  Substance Use Topics   Alcohol use: Yes    Alcohol/week: 10.0 standard drinks    Types: 10 Shots of liquor per week   Drug use: No    Home Medications Prior to Admission medications    Medication Sig Start Date End Date Taking? Authorizing Provider  doxycycline (VIBRAMYCIN) 100 MG capsule Take 1 capsule (100 mg total) by mouth 2 (two) times daily for 7 days. 09/03/20 09/10/20 Yes Volanda Napoleon, PA-C  cyanocobalamin 1000 MCG tablet Take 100 mcg by mouth daily.    [provider]  lisinopril (PRINIVIL,ZESTRIL) 10 MG tablet Take 10 mg by mouth daily.    [provider]  metFORMIN (GLUCOPHAGE) 1000 MG tablet Take 1,000 mg by mouth 2 (two) times daily with a meal.    [provider]  mupirocin ointment (BACTROBAN) 2 % Apply 1 application topically 2 (two) times daily. 01/05/20   Moye, [place], MD  OMEGA-3 FATTY ACIDS-VITAMIN E PO Take 1,000 mg by mouth.    [provider]    Allergies    Codeine, Eszopiclone, and Penicillins  Review of Systems   Review of Systems  Constitutional:  Negative for fever.  Skin:  Positive for color change and wound.  All other systems reviewed and are negative.  Physical Exam Updated Vital Signs BP 140/87   Pulse 97   Temp 98.4 F (36.9 C)   Resp 18   Ht 6\' 2"  (1.88 m)   Wt 105.7 kg  SpO2 100%   BMI 29.92 kg/m   Physical Exam Vitals and nursing note reviewed.  Constitutional:      Appearance: He is well-developed.  HENT:     Head: Normocephalic and atraumatic.  Eyes:     General: No scleral icterus.       Right eye: No discharge.        Left eye: No discharge.     Conjunctiva/sclera: Conjunctivae normal.  Pulmonary:     Effort: Pulmonary effort is normal.  Skin:    General: Skin is warm and dry.     Comments: Roughly 10 cm laceration noted from right mid calf.  Sutures and staples in place.  There are some mild surrounding erythema and some serosanguineous drainage.  There looks like there was evidence of some purulent drainage previously.  No fluctuance noted.  Neurological:     Mental Status: He is alert.  Psychiatric:        Speech: Speech normal.        Behavior: Behavior  normal.    ED Results / Procedures / Treatments   Labs (all labs ordered are listed, but only abnormal results are displayed) Labs Reviewed - No data to display  EKG None  Radiology No results found.  Procedures .Suture Removal  Date/Time: 09/03/2020 12:22 PM Performed by: Volanda Napoleon, PA-C Authorized by: Volanda Napoleon, PA-C   Consent:    Consent obtained:  Verbal   Risks discussed:  Bleeding, pain and wound separation Universal protocol:    Procedure explained and questions answered to patient or proxy's satisfaction: yes     Relevant documents present and verified: yes     Test results available: no     Imaging studies available: yes     Required blood products, implants, devices, and special equipment available: yes     Site/side marked: yes     Immediately prior to procedure, a time out was called: yes     Patient identity confirmed:  Verbally with patient Location:    Location:  Lower extremity   Lower extremity location:  Leg   Leg location:  R lower leg Procedure details:    Wound appearance:  Draining and red   Number of sutures removed:  2   Number of staples removed:  10 Post-procedure details:    Post-removal:  Dressing applied   Procedure completion:  Tolerated Comments:     10 staples were removed.  There were some remaining sutures in place help keep the wound together.  I removed 2 and noted that the wound still appeared to be healing and had some degree of separation.  The decision to have the remainder of the suture stay in place was decided to help for wound closure.  Given the duration of the wound, do not feel that new sutures help with continued wound closure or indicated.    Medications Ordered in ED Medications - No data to display  ED Course  I have reviewed the triage vital signs and the nursing notes.  Pertinent labs & imaging results that were available during my care of the patient were reviewed by me and considered in my  medical decision making (see chart for details).    MDM Rules/Calculators/A&P                          65 year old male who presents for evaluation of suture removal, wound check.  Was seen here on 08/18/2020 after sustaining  a laceration to her right lower leg.  States he has had some surrounding redness, drainage from the area.  No fevers.  On initially arrival, he is afebrile nontoxic-appearing.  Vital signs are stable.  On exam, he has a roughly 10 cm laceration with 2 sutures and staples in place him to the right mid leg.  There is some surrounding erythema as well as some drainage.  Appears he has some small amount of purulent as well as serosanguineous drainage.  No fluctuance that would indicate abscess.  Sutures removed as documented above.  Staples were removed easily.  I started removing the sutures, there was some still degree of wound separation.  Decision was made to continue to have the remaining sutures in place to continue help with wound healing.  Given the duration of his wound, do not feel that putting you stitches in place was indicated at this time given concerns for  potential infection source.  Since he does have some surrounding erythema and drainage, we will plan to put him on antibiotics to prevent infection.  Patient with allergy to penicillins.  We will put him on doxycycline. At this time, patient exhibits no emergent life-threatening condition that require further evaluation in ED. Patient had ample opportunity for questions and discussion. All patient's questions were answered with full understanding. Strict return precautions discussed. Patient expresses understanding and agreement to plan.   Portions of this note were generated with Lobbyist. Dictation errors may occur despite best attempts at proofreading.    Final Clinical Impression(s) / ED Diagnoses Final diagnoses:  Visit for suture removal  Visit for wound check    Rx / DC Orders ED  Discharge Orders          Ordered    doxycycline (VIBRAMYCIN) 100 MG capsule  2 times daily        09/03/20 1213             Johnny Beck 09/03/20 1225    Johnny Chapel, MD 09/04/20 1048

## 2020-09-03 NOTE — ED Notes (Signed)
Provider in triage to assess patient.

## 2020-09-03 NOTE — ED Triage Notes (Signed)
Pt here from staple and suture removal from rle. Denies pain.

## 2021-01-10 ENCOUNTER — Other Ambulatory Visit: Payer: Self-pay

## 2021-01-10 ENCOUNTER — Ambulatory Visit: Payer: Medicare Other | Admitting: Dermatology

## 2021-01-10 DIAGNOSIS — Z85828 Personal history of other malignant neoplasm of skin: Secondary | ICD-10-CM | POA: Diagnosis not present

## 2021-01-10 DIAGNOSIS — L578 Other skin changes due to chronic exposure to nonionizing radiation: Secondary | ICD-10-CM

## 2021-01-10 DIAGNOSIS — Z86018 Personal history of other benign neoplasm: Secondary | ICD-10-CM | POA: Diagnosis not present

## 2021-01-10 DIAGNOSIS — L570 Actinic keratosis: Secondary | ICD-10-CM | POA: Diagnosis not present

## 2021-01-10 DIAGNOSIS — D1801 Hemangioma of skin and subcutaneous tissue: Secondary | ICD-10-CM

## 2021-01-10 DIAGNOSIS — L590 Erythema ab igne [dermatitis ab igne]: Secondary | ICD-10-CM

## 2021-01-10 DIAGNOSIS — Z1283 Encounter for screening for malignant neoplasm of skin: Secondary | ICD-10-CM

## 2021-01-10 DIAGNOSIS — D229 Melanocytic nevi, unspecified: Secondary | ICD-10-CM

## 2021-01-10 DIAGNOSIS — L821 Other seborrheic keratosis: Secondary | ICD-10-CM

## 2021-01-10 DIAGNOSIS — L814 Other melanin hyperpigmentation: Secondary | ICD-10-CM

## 2021-01-10 NOTE — Progress Notes (Signed)
Follow-Up Visit   Subjective  Johnny Beck is a 65 y.o. male who presents for the following: Annual Exam (Hx SCC and dysplastic nevus - patient has noticed no new or changing moles, lesions, or spots.). The patient presents for Total-Body Skin Exam (TBSE) for skin cancer screening and mole check.  The following portions of the chart were reviewed this encounter and updated as appropriate:   Tobacco  Allergies  Meds  Problems  Med Hx  Surg Hx  Fam Hx      Review of Systems:  No other skin or systemic complaints except as noted in HPI or Assessment and Plan.  Objective  Well appearing patient in no apparent distress; mood and affect are within normal limits.  A full examination was performed including scalp, head, eyes, ears, nose, lips, neck, chest, axillae, abdomen, back, buttocks, bilateral upper extremities, bilateral lower extremities, hands, feet, fingers, toes, fingernails, and toenails. All findings within normal limits unless otherwise noted below.  R neck Clear.  R temple x 1, mid vertex scalp x 5 (6) Erythematous thin papules/macules with gritty scale.    Assessment & Plan  History of SCC (squamous cell carcinoma) of skin R neck  - No evidence of recurrence today - No lymphadenopathy - Recommend regular full body skin exams - Recommend daily broad spectrum sunscreen SPF 30+ to sun-exposed areas, reapply every 2 hours as needed.  - Call if any new or changing lesions are noted between office visits  AK (actinic keratosis) (6) R temple x 1, mid vertex scalp x 5  Prior to procedure, discussed risks of blister formation, small wound, skin dyspigmentation, or rare scar following cryotherapy. Recommend Vaseline ointment to treated areas while healing.  Actinic keratoses are precancerous spots that appear secondary to cumulative UV radiation exposure/sun exposure over time. They are chronic with expected duration over 1 year. A portion of actinic keratoses will  progress to squamous cell carcinoma of the skin. It is not possible to reliably predict which spots will progress to skin cancer and so treatment is recommended to prevent development of skin cancer.  Recommend daily broad spectrum sunscreen SPF 30+ to sun-exposed areas, reapply every 2 hours as needed.  Recommend staying in the shade or wearing long sleeves, sun glasses (UVA+UVB protection) and wide brim hats (4-inch brim around the entire circumference of the hat). Call for new or changing lesions.   Destruction of lesion - R temple x 1, mid vertex scalp x 5 Complexity: simple   Destruction method: cryotherapy   Informed consent: discussed and consent obtained   Timeout:  patient name, date of birth, surgical site, and procedure verified Lesion destroyed using liquid nitrogen: Yes   Region frozen until ice ball extended beyond lesion: Yes   Outcome: patient tolerated procedure well with no complications   Post-procedure details: wound care instructions given    Erythema ab igne Back  Recommend lower heat settings on heating pads to prevent further skin damage.   Lentigines - Scattered tan macules - Due to sun exposure - Benign-appearing, observe - Recommend daily broad spectrum sunscreen SPF 30+ to sun-exposed areas, reapply every 2 hours as needed. - Call for any changes  Seborrheic Keratoses - Stuck-on, waxy, tan-brown papules and/or plaques  - Benign-appearing - Discussed benign etiology and prognosis. - Observe - Call for any changes  Melanocytic Nevi - Tan-brown and/or pink-flesh-colored symmetric macules and papules - Benign appearing on exam today - Observation - Call clinic for new or changing moles -  Recommend daily use of broad spectrum spf 30+ sunscreen to sun-exposed areas.   Hemangiomas - Red papules - Discussed benign nature - Observe - Call for any changes  Actinic Damage - Chronic condition, secondary to cumulative UV/sun exposure - diffuse scaly  erythematous macules with underlying dyspigmentation - Recommend daily broad spectrum sunscreen SPF 30+ to sun-exposed areas, reapply every 2 hours as needed.  - Staying in the shade or wearing long sleeves, sun glasses (UVA+UVB protection) and wide brim hats (4-inch brim around the entire circumference of the hat) are also recommended for sun protection.  - Call for new or changing lesions.  History of Dysplastic Nevus - No evidence of recurrence today - Recommend regular full body skin exams - Recommend daily broad spectrum sunscreen SPF 30+ to sun-exposed areas, reapply every 2 hours as needed.  - Call if any new or changing lesions are noted between office visits  Skin cancer screening performed today.  Return for TBSE in 9-12 mths.  Luther Redo, CMA, am acting as scribe for Forest Gleason, MD .  Documentation: I have reviewed the above documentation for accuracy and completeness, and I agree with the above.  Forest Gleason, MD

## 2021-01-10 NOTE — Patient Instructions (Signed)

## 2021-01-23 ENCOUNTER — Encounter: Payer: Self-pay | Admitting: Dermatology

## 2021-11-21 ENCOUNTER — Ambulatory Visit: Payer: Medicare Other | Admitting: Dermatology

## 2021-11-21 ENCOUNTER — Encounter: Payer: Self-pay | Admitting: Dermatology

## 2021-11-21 DIAGNOSIS — L570 Actinic keratosis: Secondary | ICD-10-CM

## 2021-11-21 DIAGNOSIS — D1800 Hemangioma unspecified site: Secondary | ICD-10-CM

## 2021-11-21 DIAGNOSIS — L720 Epidermal cyst: Secondary | ICD-10-CM | POA: Diagnosis not present

## 2021-11-21 DIAGNOSIS — D229 Melanocytic nevi, unspecified: Secondary | ICD-10-CM

## 2021-11-21 DIAGNOSIS — Z85828 Personal history of other malignant neoplasm of skin: Secondary | ICD-10-CM

## 2021-11-21 DIAGNOSIS — Z86018 Personal history of other benign neoplasm: Secondary | ICD-10-CM

## 2021-11-21 DIAGNOSIS — Z1283 Encounter for screening for malignant neoplasm of skin: Secondary | ICD-10-CM | POA: Diagnosis not present

## 2021-11-21 DIAGNOSIS — L821 Other seborrheic keratosis: Secondary | ICD-10-CM

## 2021-11-21 DIAGNOSIS — L219 Seborrheic dermatitis, unspecified: Secondary | ICD-10-CM

## 2021-11-21 DIAGNOSIS — L578 Other skin changes due to chronic exposure to nonionizing radiation: Secondary | ICD-10-CM

## 2021-11-21 DIAGNOSIS — T148XXA Other injury of unspecified body region, initial encounter: Secondary | ICD-10-CM

## 2021-11-21 DIAGNOSIS — L814 Other melanin hyperpigmentation: Secondary | ICD-10-CM

## 2021-11-21 NOTE — Patient Instructions (Addendum)
Cryotherapy Aftercare  Wash gently with soap and water everyday.   Apply Vaseline daily until healed.    Recommend taking Heliocare sun protection supplement daily in sunny weather for additional sun protection. For maximum protection on the sunniest days, you can take up to 2 capsules of regular Heliocare OR take 1 capsule of Heliocare Ultra. For prolonged exposure (such as a full day in the sun), you can repeat your dose of the supplement 4 hours after your first dose. Heliocare can be purchased at [facility], at some [facility] or at www.heliocare.com.     Recommend daily broad spectrum sunscreen SPF 30+ to sun-exposed areas, reapply every 2 hours as needed. Call for new or changing lesions.  Staying in the shade or wearing long sleeves, sun glasses (UVA+UVB protection) and wide brim hats (4-inch brim around the entire circumference of the hat) are also recommended for sun protection.      Melanoma ABCDEs  Melanoma is the most dangerous type of skin cancer, and is the leading cause of death from skin disease.  You are more likely to develop melanoma if you: Have light-colored skin, light-colored eyes, or red or blond hair Spend a lot of time in the sun Tan regularly, either outdoors or in a tanning bed Have had blistering sunburns, especially during childhood Have a close family member who has had a melanoma Have atypical moles or large birthmarks  Early detection of melanoma is key since treatment is typically straightforward and cure rates are extremely high if we catch it early.   The first sign of melanoma is often a change in a mole or a new dark spot.  The ABCDE system is a way of remembering the signs of melanoma.  A for asymmetry:  The two halves do not match. B for border:  The edges of the growth are irregular. C for color:  A mixture of colors are present instead of an even brown color. D for diameter:  Melanomas are usually (but not always) greater than 6mm -  the size of a pencil eraser. E for evolution:  The spot keeps changing in size, shape, and color.  Please check your skin once per month between visits. You can use a small mirror in front and a large mirror behind you to keep an eye on the back side or your body.   If you see any new or changing lesions before your next follow-up, please call to schedule a visit.  Please continue daily skin protection including broad spectrum sunscreen SPF 30+ to sun-exposed areas, reapplying every 2 hours as needed when you're outdoors.   Staying in the shade or wearing long sleeves, sun glasses (UVA+UVB protection) and wide brim hats (4-inch brim around the entire circumference of the hat) are also recommended for sun protection.     Due to recent changes in healthcare laws, you may see results of your pathology and/or laboratory studies on MyChart before the doctors have had a chance to review them. We understand that in some cases there may be results that are confusing or concerning to you. Please understand that not all results are received at the same time and often the doctors may need to interpret multiple results in order to provide you with the best plan of care or course of treatment. Therefore, we ask that you please give us 2 business days to thoroughly review all your results before contacting the office for clarification. Should we see a critical lab result, you will   be contacted sooner.   If You Need Anything After Your Visit  If you have any questions or concerns for your doctor, please call our main line at [phone] and press option 4 to reach your doctor's medical assistant. If no one answers, please leave a voicemail as directed and we will return your call as soon as possible. Messages left after 4 pm will be answered the following business day.   You may also send us a message via MyChart. We typically respond to MyChart messages within 1-2 business days.  For prescription refills,  please ask your pharmacy to contact our office. Our fax number is [phone].  If you have an urgent issue when the clinic is closed that cannot wait until the next business day, you can page your doctor at the number below.    Please note that while we do our best to be available for urgent issues outside of office hours, we are not available 24/7.   If you have an urgent issue and are unable to reach us, you may choose to seek medical care at your doctor's office, retail clinic, urgent care center, or emergency room.  If you have a medical emergency, please immediately call 911 or go to the emergency department.  Pager Numbers  - Dr. Kowalski: [phone]  - Dr. Moye: [phone]  - Dr. Stewart: [phone]  In the event of inclement weather, please call our main line at [phone] for an update on the status of any delays or closures.  Dermatology Medication Tips: Please keep the boxes that topical medications come in in order to help keep track of the instructions about where and how to use these. Pharmacies typically print the medication instructions only on the boxes and not directly on the medication tubes.   If your medication is too expensive, please contact our office at [phone] option 4 or send us a message through MyChart.   We are unable to tell what your co-pay for medications will be in advance as this is different depending on your insurance coverage. However, we may be able to find a substitute medication at lower cost or fill out paperwork to get insurance to cover a needed medication.   If a prior authorization is required to get your medication covered by your insurance company, please allow us 1-2 business days to complete this process.  Drug prices often vary depending on where the prescription is filled and some pharmacies may offer cheaper prices.  The website www.goodrx.com contains coupons for medications through different pharmacies. The prices  here do not account for what the cost may be with help from insurance (it may be cheaper with your insurance), but the website can give you the price if you did not use any insurance.  - You can print the associated coupon and take it with your prescription to the pharmacy.  - You may also stop by our office during regular business hours and pick up a GoodRx coupon card.  - If you need your prescription sent electronically to a different pharmacy, notify our office through [place] MyChart or by phone at [phone] option 4.     Si Usted Necesita Algo Despus de Su Visita  Tambin puede enviarnos un mensaje a travs de MyChart. Por lo general respondemos a los mensajes de MyChart en el transcurso de 1 a 2 das hbiles.  Para renovar recetas, por favor pida a su farmacia que se ponga en contacto con nuestra oficina. Nuestro nmero de fax   es el [phone].  Si tiene un asunto urgente cuando la clnica est cerrada y que no puede esperar hasta el siguiente da hbil, puede llamar/localizar a su doctor(a) al nmero que aparece a continuacin.   Por favor, tenga en cuenta que aunque hacemos todo lo posible para estar disponibles para asuntos urgentes fuera del horario de oficina, no estamos disponibles las 24 horas del da, los 7 das de la semana.   Si tiene un problema urgente y no puede comunicarse con nosotros, puede optar por buscar atencin mdica  en el consultorio de su doctor(a), en una clnica privada, en un centro de atencin urgente o en una sala de emergencias.  Si tiene una emergencia mdica, por favor llame inmediatamente al 911 o vaya a la sala de emergencias.  Nmeros de bper  - Dr. Kowalski: [phone]  - Dra. Moye: [phone]  - Dra. Stewart: [phone]  En caso de inclemencias del tiempo, por favor llame a nuestra lnea principal al [phone] para una actualizacin sobre el estado de cualquier retraso o cierre.  Consejos para la medicacin en  dermatologa: Por favor, guarde las cajas en las que vienen los medicamentos de uso tpico para ayudarle a seguir las instrucciones sobre dnde y cmo usarlos. Las farmacias generalmente imprimen las instrucciones del medicamento slo en las cajas y no directamente en los tubos del medicamento.   Si su medicamento es muy caro, por favor, pngase en contacto con nuestra oficina llamando al [phone] y presione la opcin 4 o envenos un mensaje a travs de MyChart.   No podemos decirle cul ser su copago por los medicamentos por adelantado ya que esto es diferente dependiendo de la cobertura de su seguro. Sin embargo, es posible que podamos encontrar un medicamento sustituto a menor costo o llenar un formulario para que el seguro cubra el medicamento que se considera necesario.   Si se requiere una autorizacin previa para que su compaa de seguros cubra su medicamento, por favor permtanos de 1 a 2 das hbiles para completar este proceso.  Los precios de los medicamentos varan con frecuencia dependiendo del lugar de dnde se surte la receta y alguna farmacias pueden ofrecer precios ms baratos.  El sitio web www.goodrx.com tiene cupones para medicamentos de diferentes farmacias. Los precios aqu no tienen en cuenta lo que podra costar con la ayuda del seguro (puede ser ms barato con su seguro), pero el sitio web puede darle el precio si no utiliz ningn seguro.  - Puede imprimir el cupn correspondiente y llevarlo con su receta a la farmacia.  - Tambin puede pasar por nuestra oficina durante el horario de atencin regular y recoger una tarjeta de cupones de GoodRx.  - Si necesita que su receta se enve electrnicamente a una farmacia diferente, informe a nuestra oficina a travs de MyChart de [place] o por telfono llamando al [phone] y presione la opcin 4.  

## 2021-11-21 NOTE — Progress Notes (Signed)
Follow-Up Visit   Subjective  Johnny Beck is a 66 y.o. male who presents for the following: Annual Exam (Hx of SCC's. Hx of DN, severe., Hx of AKs).  The patient presents for Total-Body Skin Exam (TBSE) for skin cancer screening and mole check.  The patient has spots, moles and lesions to be evaluated, some may be new or changing and the patient has concerns that these could be cancer.  The following portions of the chart were reviewed this encounter and updated as appropriate:  Tobacco  Allergies  Meds  Problems  Med Hx  Surg Hx  Fam Hx      Review of Systems: No other skin or systemic complaints except as noted in HPI or Assessment and Plan.   Objective  Well appearing patient in no apparent distress; mood and affect are within normal limits.  A full examination was performed including scalp, head, eyes, ears, nose, lips, neck, chest, axillae, abdomen, back, buttocks, bilateral upper extremities, bilateral lower extremities, hands, feet, fingers, toes, fingernails, and toenails. All findings within normal limits unless otherwise noted below.  Left Forearm - Posterior Excoriated lesion  Chest - Medial (Center) Subcutaneous nodule.   Vertex Scalp x4 (4) Erythematous thin papules/macules with gritty scale.   Eyebrows Pink patches with greasy scale.    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 - No evidence of recurrence today - No lymphadenopathy - Recommend regular full body skin exams - Recommend daily broad spectrum sunscreen SPF 30+ to sun-exposed areas, reapply every 2 hours as needed.  - Call if any new or changing lesions are noted between office visits  History of Dysplastic Nevus. Left earlobe. Severe atypia. 2019. - No evidence of recurrence today - Recommend regular full body skin exams - Recommend daily broad spectrum sunscreen SPF 30+ to sun-exposed areas, reapply every 2 hours as needed.  -  Call if any new or changing lesions are noted between office visits   Lentigines - Scattered tan macules - Due to sun exposure - Benign-appearing, observe - Recommend daily broad spectrum sunscreen SPF 30+ to sun-exposed areas, reapply every 2 hours as needed. - Call for any changes  Seborrheic Keratoses - Stuck-on, waxy, tan-brown papules and/or plaques  - Benign-appearing - Discussed benign etiology and prognosis. - Observe - Call for any changes  Melanocytic Nevi - Tan-brown and/or pink-flesh-colored symmetric macules and papules - Benign appearing on exam today - Observation - Call clinic for new or changing moles - Recommend daily use of broad spectrum spf 30+ sunscreen to sun-exposed areas.   Hemangiomas - Red papules - Discussed benign nature - Observe - Call for any changes  Actinic Damage - Chronic condition, secondary to cumulative UV/sun exposure - diffuse scaly erythematous macules with underlying dyspigmentation - Recommend daily broad spectrum sunscreen SPF 30+ to sun-exposed areas, reapply every 2 hours as needed.  - Staying in the shade or wearing long sleeves, sun glasses (UVA+UVB protection) and wide brim hats (4-inch brim around the entire circumference of the hat) are also recommended for sun protection.  - Call for new or changing lesions.  Skin cancer screening performed today.  Excoriation Left Forearm - Posterior  Benign-appearing.  Call if not resolved in a couple of months  Epidermal inclusion cyst Chest - Medial (Center)  Benign-appearing. Exam most consistent with an epidermal inclusion cyst. Discussed that a cyst is a benign growth that can grow over time and sometimes  get irritated or inflamed. Recommend observation if it is not bothersome. Discussed option of surgical excision to remove it if it is growing, symptomatic, or other changes noted. Please call for new or changing lesions so they can be evaluated.    AK (actinic keratosis)  (4) Vertex Scalp x4  Actinic keratoses are precancerous spots that appear secondary to cumulative UV radiation exposure/sun exposure over time. They are chronic with expected duration over 1 year. A portion of actinic keratoses will progress to squamous cell carcinoma of the skin. It is not possible to reliably predict which spots will progress to skin cancer and so treatment is recommended to prevent development of skin cancer.  Recommend daily broad spectrum sunscreen SPF 30+ to sun-exposed areas, reapply every 2 hours as needed.  Recommend staying in the shade or wearing long sleeves, sun glasses (UVA+UVB protection) and wide brim hats (4-inch brim around the entire circumference of the hat). Call for new or changing lesions.  Destruction of lesion - Vertex Scalp x4  Destruction method: cryotherapy   Informed consent: discussed and consent obtained   Lesion destroyed using liquid nitrogen: Yes   Outcome: patient tolerated procedure well with no complications   Post-procedure details: wound care instructions given   Additional details:  Prior to procedure, discussed risks of blister formation, small wound, skin dyspigmentation, or rare scar following cryotherapy. Recommend Vaseline ointment to treated areas while healing.   Seborrheic dermatitis Eyebrows  Patient deferred treatment at this time.    Return in 6 months (on 05/22/2022) for AK Follow Up. TBSE 1 year.  I, Emelia Salisbury, CMA, am acting as scribe for Forest Gleason, MD.  Documentation: I have reviewed the above documentation for accuracy and completeness, and I agree with the above.  Forest Gleason, MD

## 2021-12-02 ENCOUNTER — Encounter: Payer: Self-pay | Admitting: Dermatology

## 2022-05-22 ENCOUNTER — Ambulatory Visit: Payer: Medicare Other | Admitting: Dermatology

## 2022-06-10 ENCOUNTER — Ambulatory Visit: Payer: Medicare Other | Admitting: Dermatology

## 2022-06-17 ENCOUNTER — Ambulatory Visit: Payer: Medicare Other | Admitting: Dermatology

## 2022-06-17 ENCOUNTER — Encounter: Payer: Self-pay | Admitting: Dermatology

## 2022-06-17 VITALS — BP 146/82 | HR 88

## 2022-06-17 DIAGNOSIS — L578 Other skin changes due to chronic exposure to nonionizing radiation: Secondary | ICD-10-CM | POA: Diagnosis not present

## 2022-06-17 DIAGNOSIS — L570 Actinic keratosis: Secondary | ICD-10-CM | POA: Diagnosis not present

## 2022-06-17 NOTE — Progress Notes (Unsigned)
   Follow-Up Visit   Subjective  Johnny Beck is a 67 y.o. male who presents for the following: Actinic keratosis. 6 month follow up. Scalp. Tx with LN2 6 months ago    The following portions of the chart were reviewed this encounter and updated as appropriate: medications, allergies, medical history  Review of Systems:  No other skin or systemic complaints except as noted in HPI or Assessment and Plan.  Objective  Well appearing patient in no apparent distress; mood and affect are within normal limits.  A focused examination was performed of the following areas: scalp  Relevant exam findings are noted in the Assessment and Plan.    Assessment & Plan    ACTINIC KERATOSIS Exam: Erythematous thin papules/macules with gritty scale  Actinic keratoses are precancerous spots that appear secondary to cumulative UV radiation exposure/sun exposure over time. They are chronic with expected duration over 1 year. A portion of actinic keratoses will progress to squamous cell carcinoma of the skin. It is not possible to reliably predict which spots will progress to skin cancer and so treatment is recommended to prevent development of skin cancer.  Recommend daily broad spectrum sunscreen SPF 30+ to sun-exposed areas, reapply every 2 hours as needed.  Recommend staying in the shade or wearing long sleeves, sun glasses (UVA+UVB protection) and wide brim hats (4-inch brim around the entire circumference of the hat). Call for new or changing lesions.  Treatment Plan:  Prior to procedure, discussed risks of blister formation, small wound, skin dyspigmentation, or rare scar following cryotherapy. Recommend Vaseline ointment to treated areas while healing.  Destruction Procedure Note Destruction method: cryotherapy   Informed consent: discussed and consent obtained   Lesion destroyed using liquid nitrogen: Yes   Outcome: patient tolerated procedure well with no complications   Post-procedure  details: wound care instructions given   Locations: vertex scalp x1 # of Lesions Treated: 1    Return in about 6 months (around 12/17/2022) for TBSE, AK Follow Up.  I, Emelia Salisbury, CMA, am acting as scribe for Forest Gleason, MD.   Documentation: I have reviewed the above documentation for accuracy and completeness, and I agree with the above.  Forest Gleason, MD

## 2022-06-17 NOTE — Patient Instructions (Addendum)
Cryotherapy Aftercare  Wash gently with soap and water everyday.   Apply Vaseline daily until healed.   Recommend taking Heliocare sun protection supplement daily in sunny weather for additional sun protection. For maximum protection on the sunniest days, you can take up to 2 capsules of regular Heliocare OR take 1 capsule of Heliocare Ultra. For prolonged exposure (such as a full day in the sun), you can repeat your dose of the supplement 4 hours after your first dose. Heliocare can be purchased at [employer], at some [facility] or at [email or web address].     Recommend daily broad spectrum sunscreen SPF 30+ to sun-exposed areas, reapply every 2 hours as needed. Call for new or changing lesions.  Staying in the shade or wearing long sleeves, sun glasses (UVA+UVB protection) and wide brim hats (4-inch brim around the entire circumference of the hat) are also recommended for sun protection.    Due to recent changes in healthcare laws, you may see results of your pathology and/or laboratory studies on MyChart before the doctors have had a chance to review them. We understand that in some cases there may be results that are confusing or concerning to you. Please understand that not all results are received at the same time and often the doctors may need to interpret multiple results in order to provide you with the best plan of care or course of treatment. Therefore, we ask that you please give [place] 2 business days to thoroughly review all your results before contacting the office for clarification. Should we see a critical lab result, you will be contacted sooner.   If You Need Anything After Your Visit  If you have any questions or concerns for your doctor, please call our main line at [phone] and press option 4 to reach your doctor's medical assistant. If no one answers, please leave a voicemail as directed and we will return your call as soon as possible. Messages left after 4 pm will be  answered the following business day.   You may also send [place] a message via [place]. We typically respond to MyChart messages within 1-2 business days.  For prescription refills, please ask your pharmacy to contact our office. Our fax number is [phone].  If you have an urgent issue when the clinic is closed that cannot wait until the next business day, you can page your doctor at the number below.    Please note that while we do our best to be available for urgent issues outside of office hours, we are not available 24/7.   If you have an urgent issue and are unable to reach [place], you may choose to seek medical care at your doctor's office, retail clinic, urgent care center, or emergency room.  If you have a medical emergency, please immediately call 911 or go to the emergency department.  Pager Numbers  - Dr. Nehemiah Massed: [phone]  - Dr. Laurence Ferrari: [phone]  - Dr. Nicole Kindred: [phone]  In the event of inclement weather, please call our main line at [phone] for an update on the status of any delays or closures.  Dermatology Medication Tips: Please keep the boxes that topical medications come in in order to help keep track of the instructions about where and how to use these. Pharmacies typically print the medication instructions only on the boxes and not directly on the medication tubes.   If your medication is too expensive, please contact our office at [phone] option 4 or send [place] a message through [place].  We are unable to tell what your co-pay for medications will be in advance as this is different depending on your insurance coverage. However, we may be able to find a substitute medication at lower cost or fill out paperwork to get insurance to cover a needed medication.   If a prior authorization is required to get your medication covered by your insurance company, please allow us 1-2 business days to complete this process.  Drug prices often vary depending on  where the prescription is filled and some pharmacies may offer cheaper prices.  The website www.goodrx.com contains coupons for medications through different pharmacies. The prices here do not account for what the cost may be with help from insurance (it may be cheaper with your insurance), but the website can give you the price if you did not use any insurance.  - You can print the associated coupon and take it with your prescription to the pharmacy.  - You may also stop by our office during regular business hours and pick up a GoodRx coupon card.  - If you need your prescription sent electronically to a different pharmacy, notify our office through [place] MyChart or by phone at [phone] option 4.     Si Usted Necesita Algo Despus de Su Visita  Tambin puede enviarnos un mensaje a travs de MyChart. Por lo general respondemos a los mensajes de MyChart en el transcurso de 1 a 2 das hbiles.  Para renovar recetas, por favor pida a su farmacia que se ponga en contacto con nuestra oficina. Nuestro nmero de fax es el [phone].  Si tiene un asunto urgente cuando la clnica est cerrada y que no puede esperar hasta el siguiente da hbil, puede llamar/localizar a su doctor(a) al nmero que aparece a continuacin.   Por favor, tenga en cuenta que aunque hacemos todo lo posible para estar disponibles para asuntos urgentes fuera del horario de oficina, no estamos disponibles las 24 horas del da, los 7 das de la semana.   Si tiene un problema urgente y no puede comunicarse con nosotros, puede optar por buscar atencin mdica  en el consultorio de su doctor(a), en una clnica privada, en un centro de atencin urgente o en una sala de emergencias.  Si tiene una emergencia mdica, por favor llame inmediatamente al 911 o vaya a la sala de emergencias.  Nmeros de bper  - Dr. Kowalski: [phone]  - Dra. Moye: [phone]  - Dra. Stewart: [phone]  En caso de inclemencias  del tiempo, por favor llame a nuestra lnea principal al [phone] para una actualizacin sobre el estado de cualquier retraso o cierre.  Consejos para la medicacin en dermatologa: Por favor, guarde las cajas en las que vienen los medicamentos de uso tpico para ayudarle a seguir las instrucciones sobre dnde y cmo usarlos. Las farmacias generalmente imprimen las instrucciones del medicamento slo en las cajas y no directamente en los tubos del medicamento.   Si su medicamento es muy caro, por favor, pngase en contacto con nuestra oficina llamando al [phone] y presione la opcin 4 o envenos un mensaje a travs de MyChart.   No podemos decirle cul ser su copago por los medicamentos por adelantado ya que esto es diferente dependiendo de la cobertura de su seguro. Sin embargo, es posible que podamos encontrar un medicamento sustituto a menor costo o llenar un formulario para que el seguro cubra el medicamento que se considera necesario.   Si se requiere una autorizacin previa para que su   compaa de seguros [place] su medicamento, por favor permtanos de 1 a 2 das hbiles para completar este proceso.  Los precios de los medicamentos varan con frecuencia dependiendo del Environmental consultant de dnde se surte la receta y alguna farmacias pueden ofrecer precios ms baratos.  El sitio web www.goodrx.com tiene cupones para medicamentos de Airline pilot. Los precios aqu no tienen en cuenta lo que podra costar con la ayuda del seguro (puede ser ms barato con su seguro), pero el sitio web puede darle el precio si no utiliz Research scientist (physical sciences).  - Puede imprimir el cupn correspondiente y llevarlo con su receta a la farmacia.  - Tambin puede pasar por nuestra oficina durante el horario de atencin regular y Charity fundraiser una tarjeta de cupones de GoodRx.  - Si necesita que su receta se enve electrnicamente a una farmacia diferente, informe a nuestra oficina a travs de MyChart de [place] o por telfono  llamando al [phone] y presione la opcin 4.

## 2022-10-01 DIAGNOSIS — E119 Type 2 diabetes mellitus without complications: Secondary | ICD-10-CM | POA: Diagnosis not present

## 2022-10-01 DIAGNOSIS — M25462 Effusion, left knee: Secondary | ICD-10-CM | POA: Diagnosis not present

## 2022-10-08 DIAGNOSIS — M1712 Unilateral primary osteoarthritis, left knee: Secondary | ICD-10-CM | POA: Diagnosis not present

## 2022-10-08 DIAGNOSIS — M11262 Other chondrocalcinosis, left knee: Secondary | ICD-10-CM | POA: Diagnosis not present

## 2022-12-11 ENCOUNTER — Encounter: Payer: Self-pay | Admitting: Internal Medicine

## 2022-12-17 IMAGING — RF DG ESOPHAGUS
8 of 13 series · 14 of 24 positions shown · non-contrast
Comparison: None.

CLINICAL DATA: Dysphagia for 1 month

EXAM:
ESOPHOGRAM / BARIUM SWALLOW / BARIUM TABLET STUDY
TECHNIQUE: Combined double contrast and single contrast examination performed
using effervescent crystals, thick barium liquid, and thin barium
liquid. The patient was observed with fluoroscopy swallowing a 13 mm
barium sulphate tablet.
FLUOROSCOPY TIME:  Fluoroscopy Time:
Radiation Exposure Index (if provided by the fluoroscopic device):
10.10 mGy
Number of Acquired Spot Images: 0

[Series 1: cp_standard · 0.25mm/px · 2 of 24 frames shown (1 of 8)]
[frame 2/24]
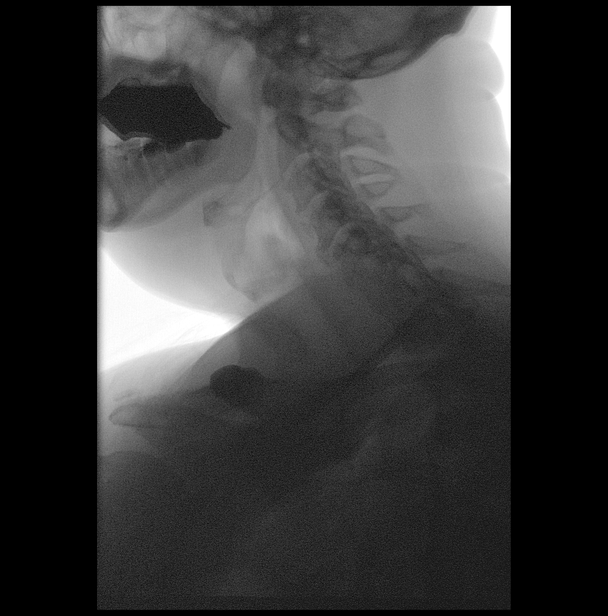
[frame 13/24]
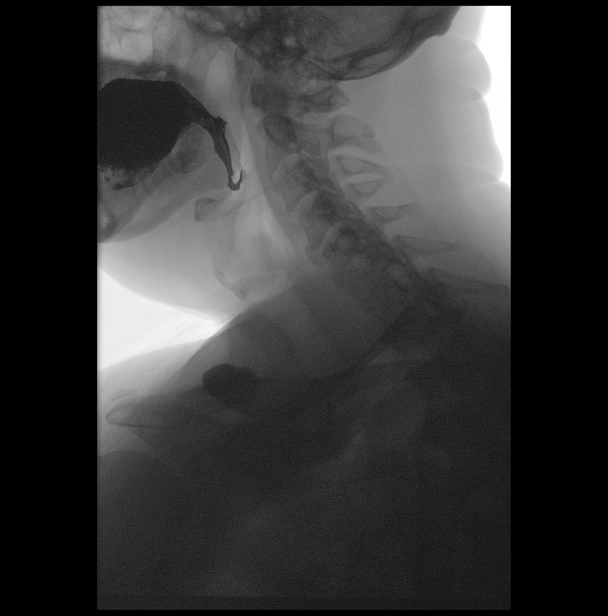

[Series 2: cp_standard · 0.25mm/px · 3 of 12 frames shown (2 of 8)]
[frame 2/12]
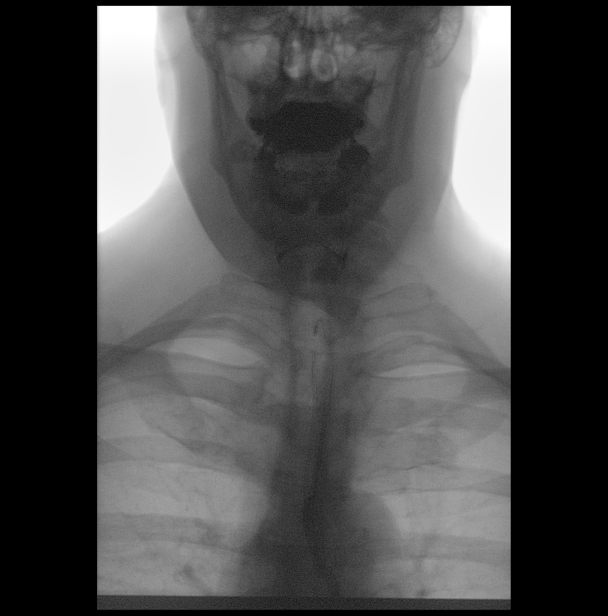
[frame 8/12]
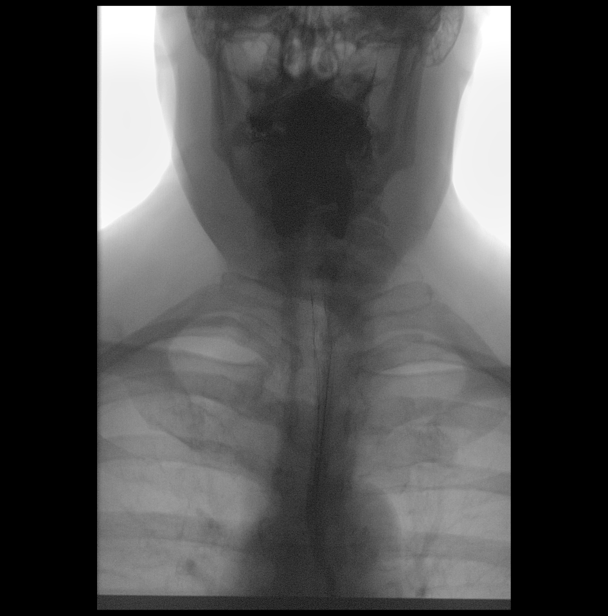
[frame 11/12]
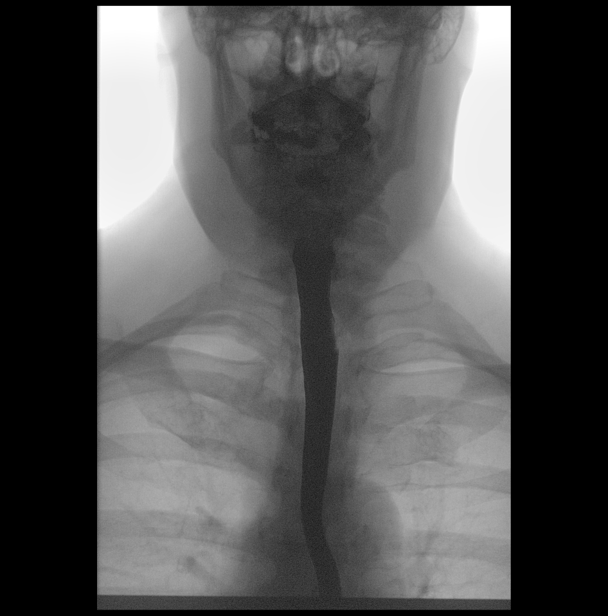

[Series 4: cp_standard · 0.25mm/px · 3 of 33 frames shown (3 of 8)]
[frame 1/33]
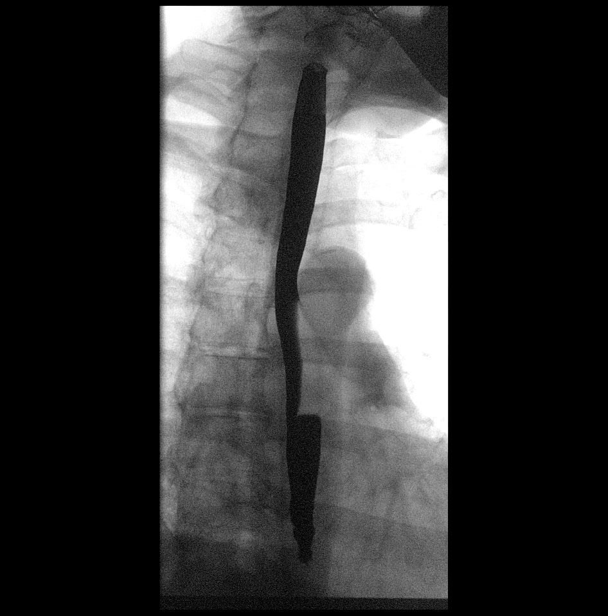
[frame 17/33]
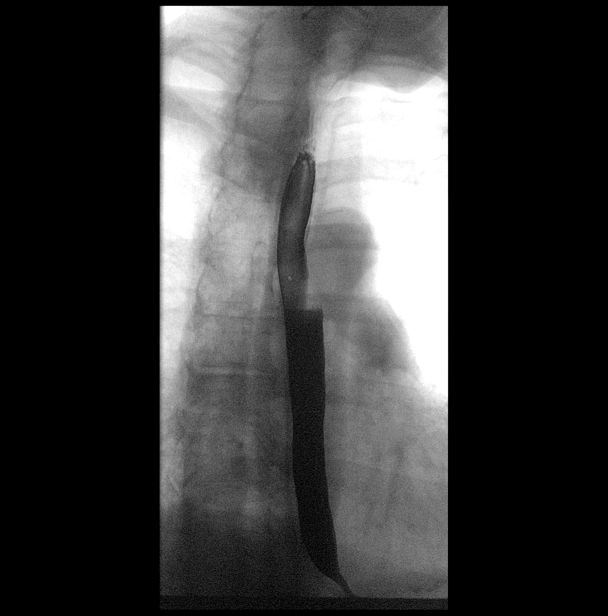
[frame 29/33]
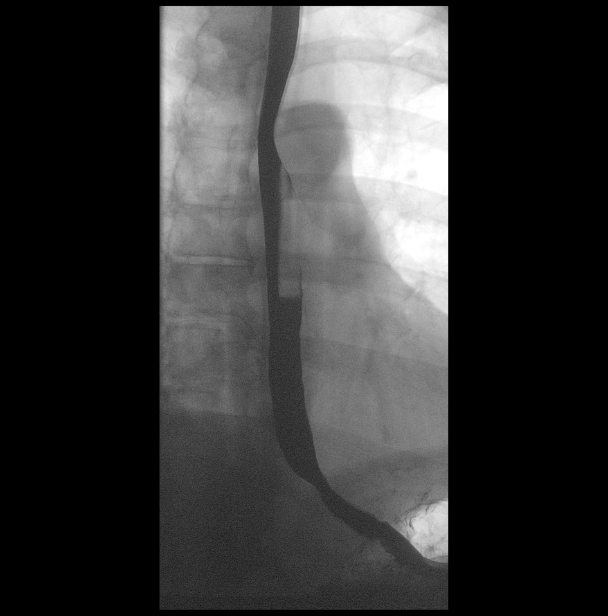

[Series 6: cp_standard · 0.25mm/px · 1 of 1 slices shown (4 of 8)]
[im 1/1]
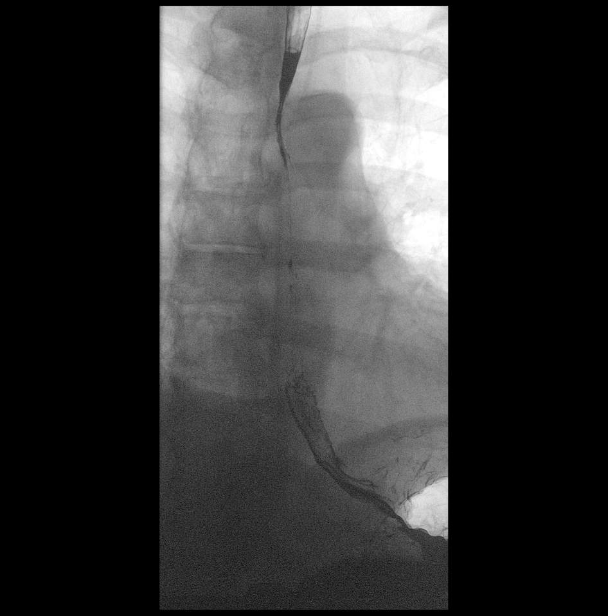

[Series 8: cp_standard · 0.25mm/px · 1 of 1 slices shown (5 of 8)]
[im 1/1]
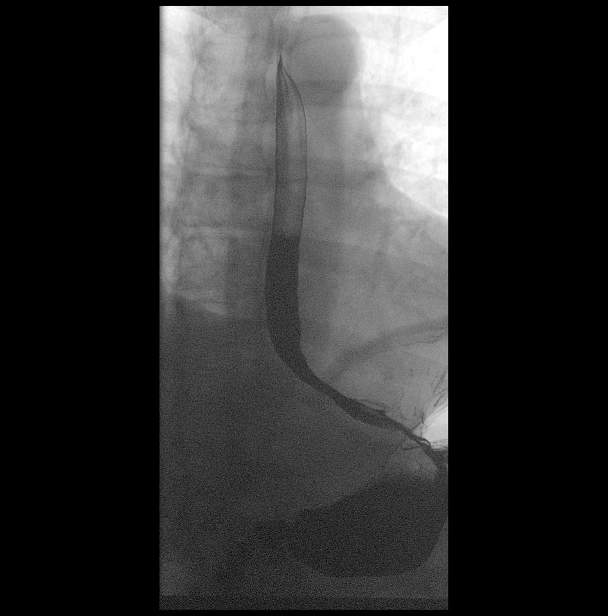

[Series 10: cp_standard · 0.26mm/px · 1 of 1 slices shown (6 of 8)]
[im 1/1]
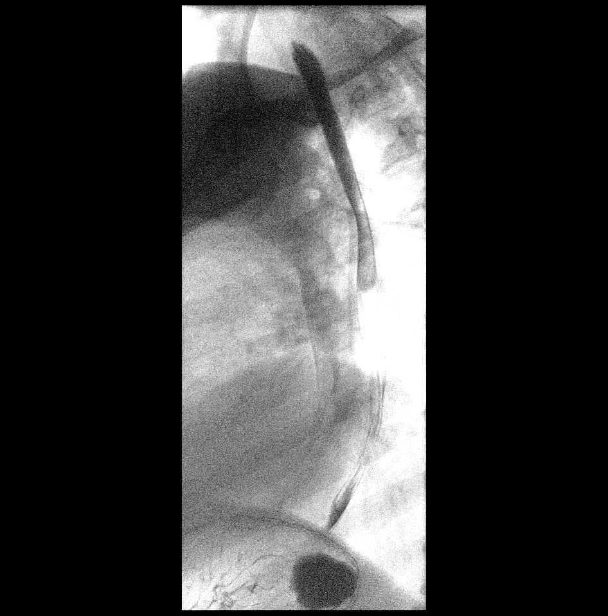

[Series 11: cp_standard · 0.26mm/px · 1 of 1 slices shown (7 of 8)]
[im 1/1]
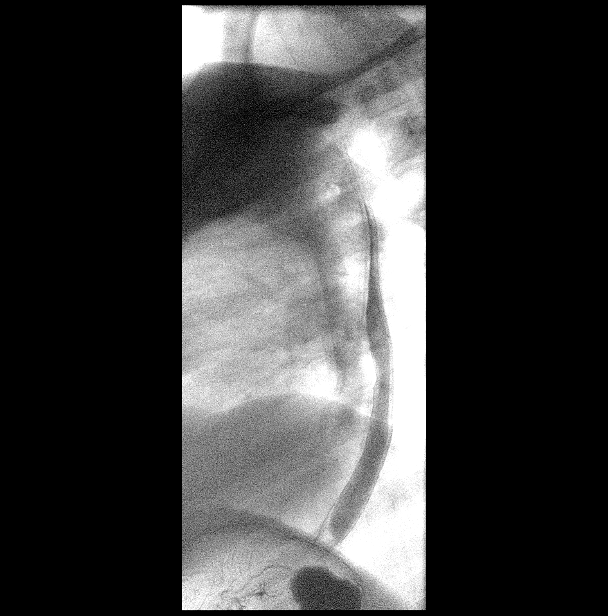

[Series 13: cp_standard · 0.26mm/px · 2 of 31 frames shown (8 of 8)]
[frame 5/31]
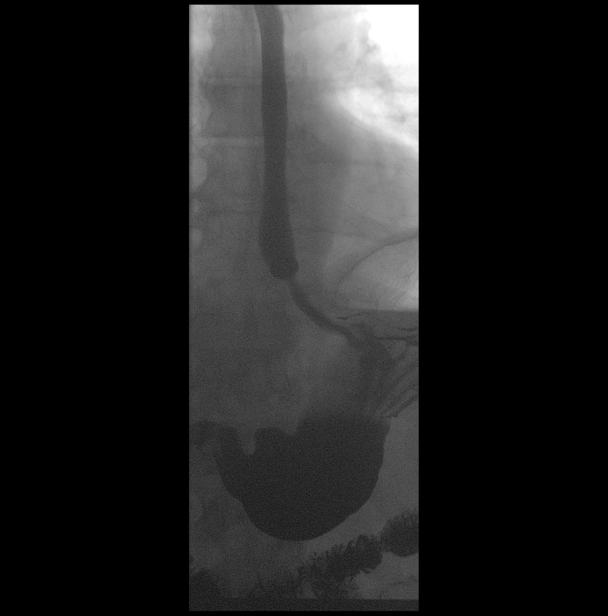
[frame 27/31]
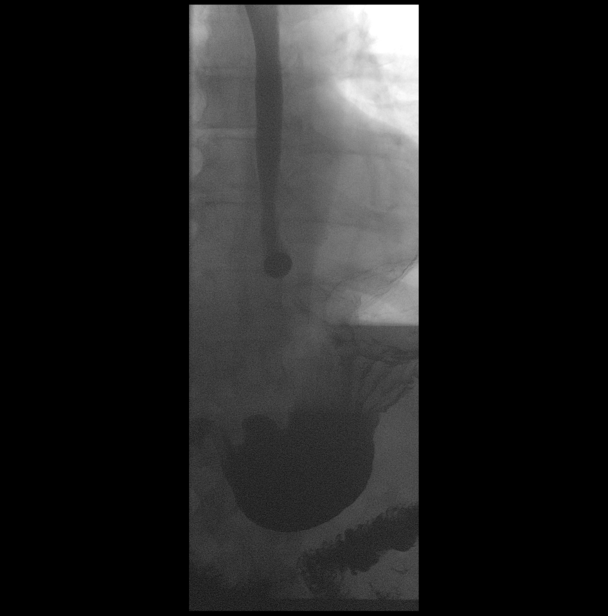

[14 of 24 positions shown; findings below may reference images not displayed]

FINDINGS: Normal pharyngeal anatomy and motility. Contrast flowed freely
through the esophagus without evidence of a mass. Smooth relative
narrowing of the distal esophagus restricting the passage of a 13 mm
barium tablet concerning for a mild stricture. Normal esophageal
mucosa without evidence of irregularity or ulceration. Esophageal
motility was normal. No evidence of reflux. No definite hiatal
hernia was demonstrated.
IMPRESSION: Smooth relative narrowing of the distal esophagus restricting the
passage of a 13 mm barium tablet concerning for a mild stricture.

## 2022-12-18 ENCOUNTER — Ambulatory Visit: Payer: Medicare PPO | Admitting: Dermatology

## 2022-12-18 ENCOUNTER — Encounter: Payer: Self-pay | Admitting: Dermatology

## 2022-12-18 VITALS — BP 126/77 | HR 91

## 2022-12-18 DIAGNOSIS — D492 Neoplasm of unspecified behavior of bone, soft tissue, and skin: Secondary | ICD-10-CM | POA: Diagnosis not present

## 2022-12-18 DIAGNOSIS — L821 Other seborrheic keratosis: Secondary | ICD-10-CM

## 2022-12-18 DIAGNOSIS — Z872 Personal history of diseases of the skin and subcutaneous tissue: Secondary | ICD-10-CM

## 2022-12-18 DIAGNOSIS — D0462 Carcinoma in situ of skin of left upper limb, including shoulder: Secondary | ICD-10-CM | POA: Diagnosis not present

## 2022-12-18 DIAGNOSIS — D099 Carcinoma in situ, unspecified: Secondary | ICD-10-CM

## 2022-12-18 DIAGNOSIS — L570 Actinic keratosis: Secondary | ICD-10-CM | POA: Diagnosis not present

## 2022-12-18 DIAGNOSIS — L578 Other skin changes due to chronic exposure to nonionizing radiation: Secondary | ICD-10-CM | POA: Diagnosis not present

## 2022-12-18 DIAGNOSIS — Z86018 Personal history of other benign neoplasm: Secondary | ICD-10-CM

## 2022-12-18 DIAGNOSIS — W908XXA Exposure to other nonionizing radiation, initial encounter: Secondary | ICD-10-CM

## 2022-12-18 DIAGNOSIS — L720 Epidermal cyst: Secondary | ICD-10-CM

## 2022-12-18 DIAGNOSIS — D1801 Hemangioma of skin and subcutaneous tissue: Secondary | ICD-10-CM

## 2022-12-18 DIAGNOSIS — D489 Neoplasm of uncertain behavior, unspecified: Secondary | ICD-10-CM

## 2022-12-18 DIAGNOSIS — L814 Other melanin hyperpigmentation: Secondary | ICD-10-CM

## 2022-12-18 DIAGNOSIS — Z85828 Personal history of other malignant neoplasm of skin: Secondary | ICD-10-CM

## 2022-12-18 HISTORY — DX: Carcinoma in situ, unspecified: D09.9

## 2022-12-18 NOTE — Patient Instructions (Addendum)
Biopsy Wound Care Instructions  Leave the original bandage on for 24 hours if possible.  If the bandage becomes soaked or soiled before that time, it is OK to remove it and examine the wound.  A small amount of post-operative bleeding is normal.  If excessive bleeding occurs, remove the bandage, place gauze over the site and apply continuous pressure (no peeking) over the area for 30 minutes. If this does not work, please call our clinic as soon as possible or page your doctor if it is after hours.   Once a day, cleanse the wound with soap and water. It is fine to shower. If a thick crust develops you may use a Q-tip dipped into dilute hydrogen peroxide (mix 1:1 with water) to dissolve it.  Hydrogen peroxide can slow the healing process, so use it only as needed.    After washing, apply petroleum jelly (Vaseline) or an antibiotic ointment if your doctor prescribed one for you, followed by a bandage.    For best healing, the wound should be covered with a layer of ointment at all times. If you are not able to keep the area covered with a bandage to hold the ointment in place, this may mean re-applying the ointment several times a day.  Continue this wound care until the wound has healed and is no longer open.   Itching and mild discomfort is normal during the healing process. However, if you develop pain or severe itching, please call our office.   If you have any discomfort, you can take Tylenol (acetaminophen) or ibuprofen as directed on the bottle. (Please do not take these if you have an allergy to them or cannot take them for another reason).  Some redness, tenderness and white or yellow material in the wound is normal healing.  If the area becomes very sore and red, or develops a thick yellow-green material (pus), it may be infected; please notify us.    If you have stitches, return to clinic as directed to have the stitches removed. You will continue wound care for 2-3 days after the stitches  are removed.   Wound healing continues for up to one year following surgery. It is not unusual to experience pain in the scar from time to time during the interval.  If the pain becomes severe or the scar thickens, you should notify the office.    A slight amount of redness in a scar is expected for the first six months.  After six months, the redness will fade and the scar will soften and fade.  The color difference becomes less noticeable with time.  If there are any problems, return for a post-op surgery check at your earliest convenience.  To improve the appearance of the scar, you can use silicone scar gel, cream, or sheets (such as Mederma or Serica) every night for up to one year. These are available over the counter (without a prescription).  Please call our office at [phone] for any questions or concerns.       Actinic keratoses are precancerous spots that appear secondary to cumulative UV radiation exposure/sun exposure over time. They are chronic with expected duration over 1 year. A portion of actinic keratoses will progress to squamous cell carcinoma of the skin. It is not possible to reliably predict which spots will progress to skin cancer and so treatment is recommended to prevent development of skin cancer.  Recommend daily broad spectrum sunscreen SPF 30+ to sun-exposed areas, reapply every 2 hours  as needed.  Recommend staying in the shade or wearing long sleeves, sun glasses (UVA+UVB protection) and wide brim hats (4-inch brim around the entire circumference of the hat). Call for new or changing lesions.    Cryotherapy Aftercare  Wash gently with soap and water everyday.   Apply Vaseline and Band-Aid daily until healed.     Melanoma ABCDEs  Melanoma is the most dangerous type of skin cancer, and is the leading cause of death from skin disease.  You are more likely to develop melanoma if you: Have light-colored skin, light-colored eyes, or red or blond  hair Spend a lot of time in the sun Tan regularly, either outdoors or in a tanning bed Have had blistering sunburns, especially during childhood Have a close family member who has had a melanoma Have atypical moles or large birthmarks  Early detection of melanoma is key since treatment is typically straightforward and cure rates are extremely high if we catch it early.   The first sign of melanoma is often a change in a mole or a new dark spot.  The ABCDE system is a way of remembering the signs of melanoma.  A for asymmetry:  The two halves do not match. B for border:  The edges of the growth are irregular. C for color:  A mixture of colors are present instead of an even brown color. D for diameter:  Melanomas are usually (but not always) greater than 6mm - the size of a pencil eraser. E for evolution:  The spot keeps changing in size, shape, and color.  Please check your skin once per month between visits. You can use a small mirror in front and a large mirror behind you to keep an eye on the back side or your body.   If you see any new or changing lesions before your next follow-up, please call to schedule a visit.  Please continue daily skin protection including broad spectrum sunscreen SPF 30+ to sun-exposed areas, reapplying every 2 hours as needed when you're outdoors.   Staying in the shade or wearing long sleeves, sun glasses (UVA+UVB protection) and wide brim hats (4-inch brim around the entire circumference of the hat) are also recommended for sun protection.    Due to recent changes in healthcare laws, you may see results of your pathology and/or laboratory studies on MyChart before the doctors have had a chance to review them. We understand that in some cases there may be results that are confusing or concerning to you. Please understand that not all results are received at the same time and often the doctors may need to interpret multiple results in order to provide you with  the best plan of care or course of treatment. Therefore, we ask that you please give [place] 2 business days to thoroughly review all your results before contacting the office for clarification. Should we see a critical lab result, you will be contacted sooner.   If You Need Anything After Your Visit  If you have any questions or concerns for your doctor, please call our main line at [phone] and press option 4 to reach your doctor's medical assistant. If no one answers, please leave a voicemail as directed and we will return your call as soon as possible. Messages left after 4 pm will be answered the following business day.   You may also send [place] a message via MyChart. We typically respond to MyChart messages within 1-2 business days.  For prescription refills, please ask  your pharmacy to contact our office. Our fax number is [phone].  If you have an urgent issue when the clinic is closed that cannot wait until the next business day, you can page your doctor at the number below.    Please note that while we do our best to be available for urgent issues outside of office hours, we are not available 24/7.   If you have an urgent issue and are unable to reach [place], you may choose to seek medical care at your doctor's office, retail clinic, urgent care center, or emergency room.  If you have a medical emergency, please immediately call 911 or go to the emergency department.  Pager Numbers  - Dr. Gwen Pounds: [phone]  - Dr. Roseanne Reno: [phone]  - Dr. Katrinka Blazing: [phone]   In the event of inclement weather, please call our main line at [phone] for an update on the status of any delays or closures.  Dermatology Medication Tips: Please keep the boxes that topical medications come in in order to help keep track of the instructions about where and how to use these. Pharmacies typically print the medication instructions only on the boxes and not directly on the medication tubes.   If  your medication is too expensive, please contact our office at [phone] option 4 or send [place] a message through MyChart.   We are unable to tell what your co-pay for medications will be in advance as this is different depending on your insurance coverage. However, we may be able to find a substitute medication at lower cost or fill out paperwork to get insurance to cover a needed medication.   If a prior authorization is required to get your medication covered by your insurance company, please allow [place] 1-2 business days to complete this process.  Drug prices often vary depending on where the prescription is filled and some pharmacies may offer cheaper prices.  The website www.goodrx.com contains coupons for medications through different pharmacies. The prices here do not account for what the cost may be with help from insurance (it may be cheaper with your insurance), but the website can give you the price if you did not use any insurance.  - You can print the associated coupon and take it with your prescription to the pharmacy.  - You may also stop by our office during regular business hours and pick up a GoodRx coupon card.  - If you need your prescription sent electronically to a different pharmacy, notify our office through [facility] or by phone at [phone] option 4.     Si Usted Necesita Algo Despus de Su Visita  Tambin puede enviarnos un mensaje a travs de Clinical cytogeneticist. Por lo general respondemos a los mensajes de MyChart en el transcurso de 1 a 2 das hbiles.  Para renovar recetas, por favor pida a su farmacia que se ponga en contacto con nuestra oficina. Annie Sable de fax es [place] [phone].  Si tiene un asunto urgente cuando la clnica est cerrada y que no puede esperar hasta el siguiente da hbil, puede llamar/localizar a su doctor(a) al nmero que aparece a continuacin.   Por favor, tenga en cuenta que aunque hacemos todo lo posible para estar disponibles para  asuntos urgentes fuera del horario de [place], no estamos disponibles las 24 horas del da, los 7 [address]  [address] de la [place].   Si tiene un problema urgente y no puede comunicarse con nosotros, puede optar por buscar atencin Sports administrator de su  doctor(a), en una clnica privada, en un centro de atencin urgente o en una sala de emergencias.  Si tiene Engineer, drilling, por favor llame inmediatamente al 911 o vaya a la sala de emergencias.  Nmeros de bper  - Dr. Gwen Pounds: [phone]  - Dra. Roseanne Reno: [phone]  - Dr. Katrinka Blazing: [phone]   En caso de inclemencias del tiempo, por favor llame a Lacy Duverney principal al [phone] para una actualizacin sobre el [place] de cualquier retraso o cierre.  Consejos para la medicacin en dermatologa: Por favor, guarde las cajas en las que vienen los medicamentos de uso tpico para ayudarle a seguir las instrucciones sobre dnde y cmo usarlos. Las farmacias generalmente imprimen las instrucciones del medicamento slo en las cajas y no directamente en los tubos del [place].   Si su medicamento es muy caro, por favor, pngase en contacto con Rolm Gala llamando al [phone] y presione la opcin 4 o envenos un mensaje a travs de Clinical cytogeneticist.   No podemos decirle cul ser su copago por los medicamentos por adelantado ya que esto es diferente dependiendo de la cobertura de su seguro. Sin embargo, es posible que podamos encontrar un medicamento sustituto a Audiological scientist un formulario para que el seguro cubra el medicamento que se considera necesario.   Si se requiere una autorizacin previa para que su compaa de seguros [place] su medicamento, por favor permtanos de 1 a 2 das hbiles para completar [address]  Los precios de los medicamentos varan con frecuencia dependiendo del Environmental consultant de dnde se surte la receta y alguna farmacias pueden ofrecer precios ms baratos.  El sitio web www.goodrx.com tiene cupones para  medicamentos de Health and safety inspector. Los precios aqu no tienen en cuenta lo que podra costar con la ayuda del seguro (puede ser ms barato con su seguro), pero el sitio web puede darle el precio si no utiliz Tourist information centre manager.  - Puede imprimir el cupn correspondiente y llevarlo con su receta a la farmacia.  - Tambin puede pasar por nuestra oficina durante el horario de atencin regular y Education officer, museum una tarjeta de cupones de GoodRx.  - Si necesita que su receta se enve electrnicamente a una farmacia diferente, informe a nuestra oficina a travs de MyChart de [place] o por telfono llamando al [phone] y presione la opcin 4.

## 2022-12-18 NOTE — Progress Notes (Signed)
Follow-Up Visit   Subjective  Johnny Beck is a 67 y.o. male who presents for the following: Skin Cancer Screening and Full Body Skin Exam hx of scc, hx of aks, hx of dysplastic nevi, reports a spot at left forearm and right temple he noticed.    The patient presents for Total-Body Skin Exam (TBSE) for skin cancer screening and mole check. The patient has spots, moles and lesions to be evaluated, some may be new or changing and the patient may have concern these could be cancer.   The following portions of the chart were reviewed this encounter and updated as appropriate: medications, allergies, medical history  Review of Systems:  No other skin or systemic complaints except as noted in HPI or Assessment and Plan.  Objective  Well appearing patient in no apparent distress; mood and affect are within normal limits.  A full examination was performed including scalp, head, eyes, ears, nose, lips, neck, chest, axillae, abdomen, back, buttocks, bilateral upper extremities, bilateral lower extremities, hands, feet, fingers, toes, fingernails, and toenails. All findings within normal limits unless otherwise noted below.   Relevant physical exam findings are noted in the Assessment and Plan.  scalp x 5, right temple x 1, left cheek x 1, (7) Erythematous thin papules/macules with gritty scale.   left distal forearm 6 mm keratotic pink papule          Assessment & Plan   SKIN CANCER SCREENING PERFORMED TODAY.  ACTINIC DAMAGE - Chronic condition, secondary to cumulative UV/sun exposure - diffuse scaly erythematous macules with underlying dyspigmentation - Recommend daily broad spectrum sunscreen SPF 30+ to sun-exposed areas, reapply every 2 hours as needed.  - Staying in the shade or wearing long sleeves, sun glasses (UVA+UVB protection) and wide brim hats (4-inch brim around the entire circumference of the hat) are also recommended for sun protection.  - Call for new or changing  lesions.  LENTIGINES, SEBORRHEIC KERATOSES, HEMANGIOMAS - Benign normal skin lesions - Benign-appearing - Call for any changes  MELANOCYTIC NEVI - Tan-brown and/or pink-flesh-colored symmetric macules and papules - Benign appearing on exam today - Observation - Call clinic for new or changing moles - Recommend daily use of broad spectrum spf 30+ sunscreen to sun-exposed areas.   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 - No evidence of recurrence today - No lymphadenopathy - Recommend regular full body skin exams - Recommend daily broad spectrum sunscreen SPF 30+ to sun-exposed areas, reapply every 2 hours as needed.  - Call if any new or changing lesions are noted between office visits   History of Dysplastic Nevus. Left earlobe. Severe atypia. 2019. - No evidence of recurrence today - Recommend regular full body skin exams - Recommend daily broad spectrum sunscreen SPF 30+ to sun-exposed areas, reapply every 2 hours as needed.  - Call if any new or changing lesions are noted between office visits   Epidermal inclusion cyst Chest - [facility])  Exam : Subcutaneous nodule.    Benign-appearing. Exam most consistent with an epidermal inclusion cyst. Discussed that a cyst is a benign growth that can grow over time and sometimes get irritated or inflamed. Recommend observation if it is not bothersome. Discussed option of surgical excision to remove it if it is growing, symptomatic, or other changes noted. Please call for new or changing lesions so they can be evaluated.  Actinic keratosis (7) scalp x 5, right temple x 1, left cheek x 1,  Actinic keratoses are precancerous spots that appear secondary to cumulative UV radiation exposure/sun exposure over time. They are chronic with expected duration over 1 year. A portion of actinic keratoses will progress to squamous cell carcinoma of the skin. It is not possible to reliably predict which spots  will progress to skin cancer and so treatment is recommended to prevent development of skin cancer.  Recommend daily broad spectrum sunscreen SPF 30+ to sun-exposed areas, reapply every 2 hours as needed.  Recommend staying in the shade or wearing long sleeves, sun glasses (UVA+UVB protection) and wide brim hats (4-inch brim around the entire circumference of the hat). Call for new or changing lesions.  Destruction of lesion - scalp x 5, right temple x 1, left cheek x 1, (7) Complexity: simple   Destruction method: cryotherapy   Informed consent: discussed and consent obtained   Timeout:  patient name, date of birth, surgical site, and procedure verified Lesion destroyed using liquid nitrogen: Yes   Region frozen until ice ball extended beyond lesion: Yes   Cryo cycles: 1 or 2. Outcome: patient tolerated procedure well with no complications   Post-procedure details: wound care instructions given    Neoplasm of uncertain behavior left distal forearm  Skin / nail biopsy Type of biopsy: tangential   Informed consent: discussed and consent obtained   Timeout: patient name, date of birth, surgical site, and procedure verified   Procedure prep:  Patient was prepped and draped in usual sterile fashion Prep type:  Isopropyl alcohol Anesthesia: the lesion was anesthetized in a standard fashion   Anesthetic:  1% lidocaine w/ epinephrine 1-100,000 buffered w/ 8.4% NaHCO3 Instrument used: DermaBlade   Hemostasis achieved with: pressure and aluminum chloride   Outcome: patient tolerated procedure well   Post-procedure details: sterile dressing applied and wound care instructions given   Dressing type: bandage and petrolatum    Specimen 1 - Surgical pathology Differential Diagnosis: R/o SCC  Check Margins: No  R/o scc   Return in about 6 months (around 06/18/2023) for TBSE.  I, Asher Muir, CMA, am acting as scribe for Elie Goody, MD.  Documentation: I have reviewed the above  documentation for accuracy and completeness, and I agree with the above.  Elie Goody, MD

## 2022-12-23 LAB — SURGICAL PATHOLOGY

## 2023-02-03 ENCOUNTER — Telehealth: Payer: Self-pay

## 2023-02-03 ENCOUNTER — Telehealth: Payer: Self-pay | Admitting: Dermatology

## 2023-02-03 NOTE — Telephone Encounter (Signed)
Patient advised pathology results showed SCCis. Patient scheduled for [facility] 02/09/23. Butch Penny., RMA

## 2023-02-03 NOTE — Telephone Encounter (Signed)
12/18/22 biopsy missed due to epic glitch  Diagnosis Skin, left distal forearm :       SQUAMOUS CELL CARCINOMA IN SITU, HYPERTROPHIC    Please call with diagnosis and message me with patient's decision on treatment.   Explanation: This is a squamous cell skin cancer limited to the top layer of skin. This means it is an early cancer and has not spread. However, it has the potential to spread beyond the skin and threaten your health, so we recommend treating it.   Treatment option 1: a cream (fluorouracil and calcipotriene) that helps your immune system clear the skin cancer. It will cause redness and irritation. Wait two weeks after the biopsy to start applying the cream. Apply the cream twice per day until the redness and irritation develop (usually occurs by day 7), then stop and allow it to heal. We will recheck the area in 2 months to ensure the cancer is gone. The cream is $35 plus shipping and will be mailed to you from a low cost compounding pharmacy. If the skin cancer comes back, we would need to do a surgery to remove it.  Treatment option 2: you return for a brief appointment where I perform electrodesiccation and curettage [facility]). This involves three rounds of scraping and burning to destroy the skin cancer. It has about an 85% cure rate and leaves a round wound slightly larger than the skin cancer and leaves a round white scar. No additional pathology is done. If the skin cancer comes back, we would need to do a surgery to remove it.

## 2023-02-09 ENCOUNTER — Encounter: Payer: Self-pay | Admitting: Dermatology

## 2023-02-09 ENCOUNTER — Ambulatory Visit: Payer: Medicare PPO | Admitting: Dermatology

## 2023-02-09 DIAGNOSIS — Z5189 Encounter for other specified aftercare: Secondary | ICD-10-CM

## 2023-02-09 DIAGNOSIS — D099 Carcinoma in situ, unspecified: Secondary | ICD-10-CM

## 2023-02-09 DIAGNOSIS — Z86007 Personal history of in-situ neoplasm of skin: Secondary | ICD-10-CM

## 2023-02-09 NOTE — Patient Instructions (Addendum)
Recommend daily broad spectrum sunscreen SPF 30+ to sun-exposed areas, reapply every 2 hours as needed. Call for new or changing lesions.  Staying in the shade or wearing long sleeves, sun glasses (UVA+UVB protection) and wide brim hats (4-inch brim around the entire circumference of the hat) are also recommended for sun protection.    Due to recent changes in healthcare laws, you may see results of your pathology and/or laboratory studies on MyChart before the doctors have had a chance to review them. We understand that in some cases there may be results that are confusing or concerning to you. Please understand that not all results are received at the same time and often the doctors may need to interpret multiple results in order to provide you with the best plan of care or course of treatment. Therefore, we ask that you please give [place] 2 business days to thoroughly review all your results before contacting the office for clarification. Should we see a critical lab result, you will be contacted sooner.   If You Need Anything After Your Visit  If you have any questions or concerns for your doctor, please call our main line at [phone] and press option 4 to reach your doctor's medical assistant. If no one answers, please leave a voicemail as directed and we will return your call as soon as possible. Messages left after 4 pm will be answered the following business day.   You may also send [place] a message via MyChart. We typically respond to MyChart messages within 1-2 business days.  For prescription refills, please ask your pharmacy to contact our office. Our fax number is [phone].  If you have an urgent issue when the clinic is closed that cannot wait until the next business day, you can page your doctor at the number below.    Please note that while we do our best to be available for urgent issues outside of office hours, we are not available 24/7.   If you have an urgent issue and are  unable to reach [place], you may choose to seek medical care at your doctor's office, retail clinic, urgent care center, or emergency room.  If you have a medical emergency, please immediately call 911 or go to the emergency department.  Pager Numbers  - Dr. Gwen Pounds: [phone]  - Dr. Roseanne Reno: [phone]  - Dr. Katrinka Blazing: [phone]   In the event of inclement weather, please call our main line at [phone] for an update on the status of any delays or closures.  Dermatology Medication Tips: Please keep the boxes that topical medications come in in order to help keep track of the instructions about where and how to use these. Pharmacies typically print the medication instructions only on the boxes and not directly on the medication tubes.   If your medication is too expensive, please contact our office at [phone] option 4 or send [place] a message through MyChart.   We are unable to tell what your co-pay for medications will be in advance as this is different depending on your insurance coverage. However, we may be able to find a substitute medication at lower cost or fill out paperwork to get insurance to cover a needed medication.   If a prior authorization is required to get your medication covered by your insurance company, please allow [place] 1-2 business days to complete this process.  Drug prices often vary depending on where the prescription is filled and some pharmacies may offer cheaper prices.  The website  www.goodrx.com contains coupons for medications through different pharmacies. The prices here do not account for what the cost may be with help from insurance (it may be cheaper with your insurance), but the website can give you the price if you did not use any insurance.  - You can print the associated coupon and take it with your prescription to the pharmacy.  - You may also stop by our office during regular business hours and pick up a GoodRx coupon card.  - If you need your  prescription sent electronically to a different pharmacy, notify our office through [facility] or by phone at [phone] option 4.     Si Usted Necesita Algo Despus de Su Visita  Tambin puede enviarnos un mensaje a travs de Clinical cytogeneticist. Por lo general respondemos a los mensajes de MyChart en el transcurso de 1 a 2 das hbiles.  Para renovar recetas, por favor pida a su farmacia que se ponga en contacto con nuestra oficina. Annie Sable de fax es [place] [phone].  Si tiene un asunto urgente cuando la clnica est cerrada y que no puede esperar hasta el siguiente da hbil, puede llamar/localizar a su doctor(a) al nmero que aparece a continuacin.   Por favor, tenga en cuenta que aunque hacemos todo lo posible para estar disponibles para asuntos urgentes fuera del horario de [place], no estamos disponibles las 24 horas del da, los 7 [address]  [address] de la [place].   Si tiene un problema urgente y no puede comunicarse con nosotros, puede optar por buscar atencin mdica  en el consultorio de su doctor(a), en una clnica privada, en un centro de atencin urgente o en una sala de emergencias.  Si tiene Engineer, drilling, por favor llame inmediatamente al 911 o vaya a la sala de emergencias.  Nmeros de bper  - Dr. Gwen Pounds: [phone]  - Dra. Roseanne Reno: [phone]  - Dr. Katrinka Blazing: [phone]   En caso de inclemencias del tiempo, por favor llame a Lacy Duverney principal al [phone] para una actualizacin sobre el [place] de cualquier retraso o cierre.  Consejos para la medicacin en dermatologa: Por favor, guarde las cajas en las que vienen los medicamentos de uso tpico para ayudarle a seguir las instrucciones sobre dnde y cmo usarlos. Las farmacias generalmente imprimen las instrucciones del medicamento slo en las cajas y no directamente en los tubos del [place].   Si su medicamento es muy caro, por favor, pngase en contacto con Rolm Gala llamando al  [phone] y presione la opcin 4 o envenos un mensaje a travs de Clinical cytogeneticist.   No podemos decirle cul ser su copago por los medicamentos por adelantado ya que esto es diferente dependiendo de la cobertura de su seguro. Sin embargo, es posible que podamos encontrar un medicamento sustituto a Audiological scientist un formulario para que el seguro cubra el medicamento que se considera necesario.   Si se requiere una autorizacin previa para que su compaa de seguros [place] su medicamento, por favor permtanos de 1 a 2 das hbiles para completar [address]  Los precios de los medicamentos varan con frecuencia dependiendo del Environmental consultant de dnde se surte la receta y alguna farmacias pueden ofrecer precios ms baratos.  El sitio web www.goodrx.com tiene cupones para medicamentos de Health and safety inspector. Los precios aqu no tienen en cuenta lo que podra costar con la ayuda del seguro (puede ser ms barato con su seguro), pero el sitio web puede darle el precio si no utiliz Tourist information centre manager.  - Puede  imprimir el cupn correspondiente y llevarlo con su receta a la farmacia.  - Tambin puede pasar por nuestra oficina durante el horario de atencin regular y Education officer, museum una tarjeta de cupones de GoodRx.  - Si necesita que su receta se enve electrnicamente a una farmacia diferente, informe a nuestra oficina a travs de MyChart de  o por telfono llamando al [phone] y presione la opcin 4.

## 2023-02-09 NOTE — Progress Notes (Signed)
   Follow-Up Visit   Subjective  Johnny Beck is a 67 y.o. male who presents for the following: Treatment of SCCis. Left distal forearm. Bx: 12/18/2022.    The patient has spots, moles and lesions to be evaluated, some may be new or changing and the patient may have concern these could be cancer.   The following portions of the chart were reviewed this encounter and updated as appropriate: medications, allergies, medical history  Review of Systems:  No other skin or systemic complaints except as noted in HPI or Assessment and Plan.  Objective  Well appearing patient in no apparent distress; mood and affect are within normal limits.  A focused examination was performed of the following areas: Left forearm  Relevant exam findings are noted in the Assessment and Plan.  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 Patient agrees to watch area for now. Will recheck at [facility] appointment.   Visit for wound check    Return for TBSE As Scheduled, Recheck SCCis at left arm.Jacquiline Doe, CMA, am acting as scribe for Elie Goody, MD.   Documentation: I have reviewed the above documentation for accuracy and completeness, and I agree with the above.  Elie Goody, MD

## 2023-04-22 DIAGNOSIS — M5430 Sciatica, unspecified side: Secondary | ICD-10-CM | POA: Diagnosis not present

## 2023-04-22 DIAGNOSIS — M549 Dorsalgia, unspecified: Secondary | ICD-10-CM | POA: Diagnosis not present

## 2023-05-19 ENCOUNTER — Encounter: Payer: Self-pay | Admitting: Internal Medicine

## 2023-05-26 DIAGNOSIS — M546 Pain in thoracic spine: Secondary | ICD-10-CM | POA: Diagnosis not present

## 2023-05-26 DIAGNOSIS — M542 Cervicalgia: Secondary | ICD-10-CM | POA: Diagnosis not present

## 2023-05-26 DIAGNOSIS — M62830 Muscle spasm of back: Secondary | ICD-10-CM | POA: Diagnosis not present

## 2023-05-26 DIAGNOSIS — M9903 Segmental and somatic dysfunction of lumbar region: Secondary | ICD-10-CM | POA: Diagnosis not present

## 2023-05-26 DIAGNOSIS — M9901 Segmental and somatic dysfunction of cervical region: Secondary | ICD-10-CM | POA: Diagnosis not present

## 2023-05-26 DIAGNOSIS — M9902 Segmental and somatic dysfunction of thoracic region: Secondary | ICD-10-CM | POA: Diagnosis not present

## 2023-06-01 DIAGNOSIS — K209 Esophagitis, unspecified without bleeding: Secondary | ICD-10-CM | POA: Diagnosis not present

## 2023-06-01 DIAGNOSIS — Z860101 Personal history of adenomatous and serrated colon polyps: Secondary | ICD-10-CM | POA: Diagnosis not present

## 2023-06-01 DIAGNOSIS — R1319 Other dysphagia: Secondary | ICD-10-CM | POA: Diagnosis not present

## 2023-06-01 DIAGNOSIS — Z8719 Personal history of other diseases of the digestive system: Secondary | ICD-10-CM | POA: Diagnosis not present

## 2023-06-01 DIAGNOSIS — E119 Type 2 diabetes mellitus without complications: Secondary | ICD-10-CM | POA: Diagnosis not present

## 2023-06-01 DIAGNOSIS — Z125 Encounter for screening for malignant neoplasm of prostate: Secondary | ICD-10-CM | POA: Diagnosis not present

## 2023-06-01 DIAGNOSIS — K2950 Unspecified chronic gastritis without bleeding: Secondary | ICD-10-CM | POA: Diagnosis not present

## 2023-06-05 DIAGNOSIS — K760 Fatty (change of) liver, not elsewhere classified: Secondary | ICD-10-CM | POA: Diagnosis not present

## 2023-06-05 DIAGNOSIS — Z0001 Encounter for general adult medical examination with abnormal findings: Secondary | ICD-10-CM | POA: Diagnosis not present

## 2023-06-05 DIAGNOSIS — D696 Thrombocytopenia, unspecified: Secondary | ICD-10-CM | POA: Diagnosis not present

## 2023-06-05 DIAGNOSIS — E785 Hyperlipidemia, unspecified: Secondary | ICD-10-CM | POA: Diagnosis not present

## 2023-06-05 DIAGNOSIS — E1142 Type 2 diabetes mellitus with diabetic polyneuropathy: Secondary | ICD-10-CM | POA: Diagnosis not present

## 2023-06-05 DIAGNOSIS — I10 Essential (primary) hypertension: Secondary | ICD-10-CM | POA: Diagnosis not present

## 2023-06-05 DIAGNOSIS — Z1331 Encounter for screening for depression: Secondary | ICD-10-CM | POA: Diagnosis not present

## 2023-06-05 DIAGNOSIS — Z0000 Encounter for general adult medical examination without abnormal findings: Secondary | ICD-10-CM | POA: Diagnosis not present

## 2023-06-11 ENCOUNTER — Encounter: Payer: Self-pay | Admitting: Internal Medicine

## 2023-06-18 ENCOUNTER — Ambulatory Visit: Payer: Medicare PPO | Admitting: Dermatology

## 2023-06-18 ENCOUNTER — Encounter: Payer: Self-pay | Admitting: Dermatology

## 2023-06-18 DIAGNOSIS — M1000 Idiopathic gout, unspecified site: Secondary | ICD-10-CM | POA: Insufficient documentation

## 2023-06-18 DIAGNOSIS — Z85828 Personal history of other malignant neoplasm of skin: Secondary | ICD-10-CM

## 2023-06-18 DIAGNOSIS — L578 Other skin changes due to chronic exposure to nonionizing radiation: Secondary | ICD-10-CM | POA: Diagnosis not present

## 2023-06-18 DIAGNOSIS — T148XXA Other injury of unspecified body region, initial encounter: Secondary | ICD-10-CM

## 2023-06-18 DIAGNOSIS — L570 Actinic keratosis: Secondary | ICD-10-CM

## 2023-06-18 DIAGNOSIS — Z1283 Encounter for screening for malignant neoplasm of skin: Secondary | ICD-10-CM | POA: Diagnosis not present

## 2023-06-18 DIAGNOSIS — K760 Fatty (change of) liver, not elsewhere classified: Secondary | ICD-10-CM | POA: Insufficient documentation

## 2023-06-18 DIAGNOSIS — I10 Essential (primary) hypertension: Secondary | ICD-10-CM | POA: Insufficient documentation

## 2023-06-18 DIAGNOSIS — L720 Epidermal cyst: Secondary | ICD-10-CM

## 2023-06-18 DIAGNOSIS — D1801 Hemangioma of skin and subcutaneous tissue: Secondary | ICD-10-CM | POA: Diagnosis not present

## 2023-06-18 DIAGNOSIS — D692 Other nonthrombocytopenic purpura: Secondary | ICD-10-CM

## 2023-06-18 DIAGNOSIS — L814 Other melanin hyperpigmentation: Secondary | ICD-10-CM | POA: Diagnosis not present

## 2023-06-18 DIAGNOSIS — R222 Localized swelling, mass and lump, trunk: Secondary | ICD-10-CM | POA: Diagnosis not present

## 2023-06-18 DIAGNOSIS — D229 Melanocytic nevi, unspecified: Secondary | ICD-10-CM

## 2023-06-18 DIAGNOSIS — W908XXA Exposure to other nonionizing radiation, initial encounter: Secondary | ICD-10-CM | POA: Diagnosis not present

## 2023-06-18 DIAGNOSIS — E785 Hyperlipidemia, unspecified: Secondary | ICD-10-CM | POA: Insufficient documentation

## 2023-06-18 DIAGNOSIS — Z86018 Personal history of other benign neoplasm: Secondary | ICD-10-CM

## 2023-06-18 DIAGNOSIS — E119 Type 2 diabetes mellitus without complications: Secondary | ICD-10-CM | POA: Insufficient documentation

## 2023-06-18 DIAGNOSIS — L821 Other seborrheic keratosis: Secondary | ICD-10-CM | POA: Diagnosis not present

## 2023-06-18 NOTE — Progress Notes (Signed)
 Follow-Up Visit   Subjective  Johnny Beck is a 68 y.o. male who presents for the following: Skin Cancer Screening and Full Body Skin Exam  The patient presents for Total-Body Skin Exam (TBSE) for skin cancer screening and mole check. The patient has spots, moles and lesions to be evaluated, some may be new or changing and the patient may have concern these could be cancer.    The following portions of the chart were reviewed this encounter and updated as appropriate: medications, allergies, medical history  Review of Systems:  No other skin or systemic complaints except as noted in HPI or Assessment and Plan.  Objective  Well appearing patient in no apparent distress; mood and affect are within normal limits.  A full examination was performed including scalp, head, eyes, ears, nose, lips, neck, chest, axillae, abdomen, back, buttocks, bilateral upper extremities, bilateral lower extremities, hands, feet, fingers, toes, fingernails, and toenails. All findings within normal limits unless otherwise noted below.   Relevant physical exam findings are noted in the Assessment and Plan.  Scalp x2, L cheek x1, R temple x1 (4) Erythematous thin papules/macules with gritty scale.   Assessment & Plan   SKIN CANCER SCREENING PERFORMED TODAY.  HISTORY OF SQUAMOUS CELL CARCINOMA OF THE SKIN. Left distal dorsal forearm. Bx: 12/18/2022. Clear on follow-up 02/09/2023.  - No evidence of recurrence today - No lymphadenopathy - Recommend regular full body skin exams - Recommend daily broad spectrum sunscreen SPF 30+ to sun-exposed areas, reapply every 2 hours as needed.  - Call if any new or changing lesions are noted between office visits  HISTORY OF SQUAMOUS CELL CARCINOMA OF THE SKIN. Right upper back, right neck.  - No evidence of recurrence today - No lymphadenopathy - Recommend regular full body skin exams - Recommend daily broad spectrum sunscreen SPF 30+ to sun-exposed areas, reapply  every 2 hours as needed.  - Call if any new or changing lesions are noted between office visits  HISTORY OF DYSPLASTIC NEVUS. Left earlobe. Severe atypia. 2019.  No evidence of recurrence today Recommend regular full body skin exams Recommend daily broad spectrum sunscreen SPF 30+ to sun-exposed areas, reapply every 2 hours as needed.  Call if any new or changing lesions are noted between office visits    ACTINIC DAMAGE - Chronic condition, secondary to cumulative UV/sun exposure - diffuse scaly erythematous macules with underlying dyspigmentation - Recommend daily broad spectrum sunscreen SPF 30+ to sun-exposed areas, reapply every 2 hours as needed.  - Staying in the shade or wearing long sleeves, sun glasses (UVA+UVB protection) and wide brim hats (4-inch brim around the entire circumference of the hat) are also recommended for sun protection.  - Call for new or changing lesions.  LENTIGINES, SEBORRHEIC KERATOSES, HEMANGIOMAS - Benign normal skin lesions - Benign-appearing - Call for any changes  MELANOCYTIC NEVI - Tan-brown and/or pink-flesh-colored symmetric macules and papules - Benign appearing on exam today - Observation - Call clinic for new or changing moles - Recommend daily use of broad spectrum spf 30+ sunscreen to sun-exposed areas.    EPIDERMAL INCLUSION CYST Exam: Large subcutaneous nodule at mid chest  Benign-appearing. Exam most consistent with an epidermal inclusion cyst. Discussed that a cyst is a benign growth that can grow over time and sometimes get irritated or inflamed. Recommend observation if it is not bothersome. Discussed option of surgical excision to remove it if it is growing, symptomatic, or other changes noted. Please call for new or changing lesions  so they can be evaluated.   AK (ACTINIC KERATOSIS) (4) Scalp x2, L cheek x1, R temple x1 (4) Actinic keratoses are precancerous spots that appear secondary to cumulative UV radiation exposure/sun  exposure over time. They are chronic with expected duration over 1 year. A portion of actinic keratoses will progress to squamous cell carcinoma of the skin. It is not possible to reliably predict which spots will progress to skin cancer and so treatment is recommended to prevent development of skin cancer.  Recommend daily broad spectrum sunscreen SPF 30+ to sun-exposed areas, reapply every 2 hours as needed.  Recommend staying in the shade or wearing long sleeves, sun glasses (UVA+UVB protection) and wide brim hats (4-inch brim around the entire circumference of the hat). Call for new or changing lesions. Destruction of lesion - Scalp x2, L cheek x1, R temple x1 (4) Complexity: simple   Destruction method: cryotherapy   Informed consent: discussed and consent obtained   Timeout:  patient name, date of birth, surgical site, and procedure verified Lesion destroyed using liquid nitrogen: Yes   Region frozen until ice ball extended beyond lesion: Yes   Cryo cycles: 1 or 2. Outcome: patient tolerated procedure well with no complications   Post-procedure details: wound care instructions given   Additional details:  Prior to procedure, discussed risks of blister formation, small wound, skin dyspigmentation, or rare scar following cryotherapy. Recommend Vaseline ointment to treated areas while healing.   Return in about 6 months (around 12/18/2023) for TBSE, AK Follow Up, HxSCC, HxDN.  I, Lawson Radar, CMA, am acting as scribe for Elie Goody, MD.   Documentation: I have reviewed the above documentation for accuracy and completeness, and I agree with the above.  Elie Goody, MD

## 2023-06-18 NOTE — Patient Instructions (Signed)

## 2023-06-24 DIAGNOSIS — M9903 Segmental and somatic dysfunction of lumbar region: Secondary | ICD-10-CM | POA: Diagnosis not present

## 2023-06-24 DIAGNOSIS — M62830 Muscle spasm of back: Secondary | ICD-10-CM | POA: Diagnosis not present

## 2023-06-24 DIAGNOSIS — M9901 Segmental and somatic dysfunction of cervical region: Secondary | ICD-10-CM | POA: Diagnosis not present

## 2023-06-24 DIAGNOSIS — M9902 Segmental and somatic dysfunction of thoracic region: Secondary | ICD-10-CM | POA: Diagnosis not present

## 2023-06-24 DIAGNOSIS — M542 Cervicalgia: Secondary | ICD-10-CM | POA: Diagnosis not present

## 2023-06-24 DIAGNOSIS — M546 Pain in thoracic spine: Secondary | ICD-10-CM | POA: Diagnosis not present

## 2023-06-29 DIAGNOSIS — E119 Type 2 diabetes mellitus without complications: Secondary | ICD-10-CM | POA: Diagnosis not present

## 2023-06-29 DIAGNOSIS — R600 Localized edema: Secondary | ICD-10-CM | POA: Diagnosis not present

## 2023-07-08 DIAGNOSIS — M542 Cervicalgia: Secondary | ICD-10-CM | POA: Diagnosis not present

## 2023-07-08 DIAGNOSIS — M9902 Segmental and somatic dysfunction of thoracic region: Secondary | ICD-10-CM | POA: Diagnosis not present

## 2023-07-08 DIAGNOSIS — M62830 Muscle spasm of back: Secondary | ICD-10-CM | POA: Diagnosis not present

## 2023-07-08 DIAGNOSIS — M9901 Segmental and somatic dysfunction of cervical region: Secondary | ICD-10-CM | POA: Diagnosis not present

## 2023-07-08 DIAGNOSIS — M546 Pain in thoracic spine: Secondary | ICD-10-CM | POA: Diagnosis not present

## 2023-07-08 DIAGNOSIS — M9903 Segmental and somatic dysfunction of lumbar region: Secondary | ICD-10-CM | POA: Diagnosis not present

## 2023-07-11 ENCOUNTER — Encounter: Payer: Self-pay | Admitting: Internal Medicine

## 2023-07-16 DIAGNOSIS — M48062 Spinal stenosis, lumbar region with neurogenic claudication: Secondary | ICD-10-CM | POA: Diagnosis not present

## 2023-07-16 DIAGNOSIS — M5416 Radiculopathy, lumbar region: Secondary | ICD-10-CM | POA: Diagnosis not present

## 2023-07-27 ENCOUNTER — Ambulatory Visit: Payer: Self-pay

## 2023-07-27 DIAGNOSIS — Z860101 Personal history of adenomatous and serrated colon polyps: Secondary | ICD-10-CM | POA: Diagnosis not present

## 2023-07-27 DIAGNOSIS — K2289 Other specified disease of esophagus: Secondary | ICD-10-CM | POA: Diagnosis not present

## 2023-07-27 DIAGNOSIS — Z09 Encounter for follow-up examination after completed treatment for conditions other than malignant neoplasm: Secondary | ICD-10-CM | POA: Diagnosis not present

## 2023-07-27 DIAGNOSIS — Z860100 Personal history of colon polyps, unspecified: Secondary | ICD-10-CM | POA: Diagnosis not present

## 2023-07-27 DIAGNOSIS — K635 Polyp of colon: Secondary | ICD-10-CM | POA: Diagnosis not present

## 2023-07-27 DIAGNOSIS — R1319 Other dysphagia: Secondary | ICD-10-CM | POA: Diagnosis not present

## 2023-07-27 DIAGNOSIS — K2970 Gastritis, unspecified, without bleeding: Secondary | ICD-10-CM | POA: Diagnosis not present

## 2023-07-27 DIAGNOSIS — K640 First degree hemorrhoids: Secondary | ICD-10-CM

## 2023-07-27 DIAGNOSIS — K5730 Diverticulosis of large intestine without perforation or abscess without bleeding: Secondary | ICD-10-CM | POA: Diagnosis not present

## 2023-07-27 DIAGNOSIS — D123 Benign neoplasm of transverse colon: Secondary | ICD-10-CM | POA: Diagnosis not present

## 2023-08-14 DIAGNOSIS — M48062 Spinal stenosis, lumbar region with neurogenic claudication: Secondary | ICD-10-CM | POA: Diagnosis not present

## 2023-08-14 DIAGNOSIS — F1020 Alcohol dependence, uncomplicated: Secondary | ICD-10-CM | POA: Diagnosis not present

## 2023-08-14 DIAGNOSIS — M5416 Radiculopathy, lumbar region: Secondary | ICD-10-CM | POA: Diagnosis not present

## 2023-09-08 DIAGNOSIS — M1712 Unilateral primary osteoarthritis, left knee: Secondary | ICD-10-CM | POA: Diagnosis not present

## 2023-09-29 DIAGNOSIS — E119 Type 2 diabetes mellitus without complications: Secondary | ICD-10-CM | POA: Diagnosis not present

## 2023-10-06 DIAGNOSIS — D696 Thrombocytopenia, unspecified: Secondary | ICD-10-CM | POA: Diagnosis not present

## 2023-10-06 DIAGNOSIS — E1142 Type 2 diabetes mellitus with diabetic polyneuropathy: Secondary | ICD-10-CM | POA: Diagnosis not present

## 2023-10-06 DIAGNOSIS — K760 Fatty (change of) liver, not elsewhere classified: Secondary | ICD-10-CM | POA: Diagnosis not present

## 2023-10-06 DIAGNOSIS — I10 Essential (primary) hypertension: Secondary | ICD-10-CM | POA: Diagnosis not present

## 2023-10-06 DIAGNOSIS — E785 Hyperlipidemia, unspecified: Secondary | ICD-10-CM | POA: Diagnosis not present

## 2023-10-22 DIAGNOSIS — M1712 Unilateral primary osteoarthritis, left knee: Secondary | ICD-10-CM | POA: Diagnosis not present

## 2023-10-22 DIAGNOSIS — M11262 Other chondrocalcinosis, left knee: Secondary | ICD-10-CM | POA: Diagnosis not present

## 2023-11-10 DIAGNOSIS — M1712 Unilateral primary osteoarthritis, left knee: Secondary | ICD-10-CM | POA: Diagnosis not present

## 2023-11-10 DIAGNOSIS — M11262 Other chondrocalcinosis, left knee: Secondary | ICD-10-CM | POA: Diagnosis not present

## 2023-12-09 DIAGNOSIS — H40023 Open angle with borderline findings, high risk, bilateral: Secondary | ICD-10-CM | POA: Diagnosis not present

## 2023-12-09 DIAGNOSIS — H5203 Hypermetropia, bilateral: Secondary | ICD-10-CM | POA: Diagnosis not present

## 2023-12-09 DIAGNOSIS — H02882 Meibomian gland dysfunction right lower eyelid: Secondary | ICD-10-CM | POA: Diagnosis not present

## 2023-12-09 DIAGNOSIS — H02885 Meibomian gland dysfunction left lower eyelid: Secondary | ICD-10-CM | POA: Diagnosis not present

## 2023-12-09 DIAGNOSIS — H1045 Other chronic allergic conjunctivitis: Secondary | ICD-10-CM | POA: Diagnosis not present

## 2023-12-09 DIAGNOSIS — H2513 Age-related nuclear cataract, bilateral: Secondary | ICD-10-CM | POA: Diagnosis not present

## 2023-12-09 DIAGNOSIS — E119 Type 2 diabetes mellitus without complications: Secondary | ICD-10-CM | POA: Diagnosis not present

## 2023-12-17 ENCOUNTER — Encounter: Payer: Self-pay | Admitting: Dermatology

## 2023-12-17 ENCOUNTER — Ambulatory Visit: Admitting: Dermatology

## 2023-12-17 DIAGNOSIS — L821 Other seborrheic keratosis: Secondary | ICD-10-CM

## 2023-12-17 DIAGNOSIS — L570 Actinic keratosis: Secondary | ICD-10-CM

## 2023-12-17 DIAGNOSIS — L814 Other melanin hyperpigmentation: Secondary | ICD-10-CM

## 2023-12-17 DIAGNOSIS — D1801 Hemangioma of skin and subcutaneous tissue: Secondary | ICD-10-CM | POA: Diagnosis not present

## 2023-12-17 DIAGNOSIS — B353 Tinea pedis: Secondary | ICD-10-CM

## 2023-12-17 DIAGNOSIS — D485 Neoplasm of uncertain behavior of skin: Secondary | ICD-10-CM | POA: Diagnosis not present

## 2023-12-17 DIAGNOSIS — W908XXA Exposure to other nonionizing radiation, initial encounter: Secondary | ICD-10-CM | POA: Diagnosis not present

## 2023-12-17 DIAGNOSIS — Z1283 Encounter for screening for malignant neoplasm of skin: Secondary | ICD-10-CM | POA: Diagnosis not present

## 2023-12-17 DIAGNOSIS — L720 Epidermal cyst: Secondary | ICD-10-CM

## 2023-12-17 DIAGNOSIS — B351 Tinea unguium: Secondary | ICD-10-CM

## 2023-12-17 DIAGNOSIS — L578 Other skin changes due to chronic exposure to nonionizing radiation: Secondary | ICD-10-CM | POA: Diagnosis not present

## 2023-12-17 DIAGNOSIS — Z85828 Personal history of other malignant neoplasm of skin: Secondary | ICD-10-CM

## 2023-12-17 DIAGNOSIS — D229 Melanocytic nevi, unspecified: Secondary | ICD-10-CM

## 2023-12-17 NOTE — Progress Notes (Signed)
 Follow-Up Visit   Subjective  Johnny Beck is a 68 y.o. male who presents for the following: Skin Cancer Screening and Full Body Skin Exam  The patient presents for Total-Body Skin Exam (TBSE) for skin cancer screening and mole check. The patient has spots, moles and lesions to be evaluated, some may be new or changing and the patient may have concern these could be cancer.  Hx SCC, DN.  The following portions of the chart were reviewed this encounter and updated as appropriate: medications, allergies, medical history  Review of Systems:  No other skin or systemic complaints except as noted in HPI or Assessment and Plan.  Objective  Well appearing patient in no apparent distress; mood and affect are within normal limits.  A full examination was performed including scalp, head, eyes, ears, nose, lips, neck, chest, axillae, abdomen, back, buttocks, bilateral upper extremities, bilateral lower extremities, hands, feet, fingers, toes, fingernails, and toenails. All findings within normal limits unless otherwise noted below.   Relevant physical exam findings are noted in the Assessment and Plan.  Scalp x 15, R temple x 1 (16) Erythematous thin papules/macules with gritty scale.  mid abdomen Tan to dark brown macule 6 mm   Assessment & Plan   SKIN CANCER SCREENING PERFORMED TODAY.  ACTINIC DAMAGE - Chronic condition, secondary to cumulative UV/sun exposure - diffuse scaly erythematous macules with underlying dyspigmentation - Recommend daily broad spectrum sunscreen SPF 30+ to sun-exposed areas, reapply every 2 hours as needed.  - Staying in the shade or wearing long sleeves, sun glasses (UVA+UVB protection) and wide brim hats (4-inch brim around the entire circumference of the hat) are also recommended for sun protection.  - Call for new or changing lesions.  LENTIGINES, SEBORRHEIC KERATOSES, HEMANGIOMAS - Benign normal skin lesions - Benign-appearing - Call for any  changes  MELANOCYTIC NEVI - Tan-brown and/or pink-flesh-colored symmetric macules and papules - Benign appearing on exam today - Observation - Call clinic for new or changing moles - Recommend daily use of broad spectrum spf 30+ sunscreen to sun-exposed areas.   HISTORY OF SQUAMOUS CELL CARCINOMA OF THE SKIN. Left distal dorsal forearm. Bx: 12/18/2022. Clear on follow-up 02/09/2023.  - No evidence of recurrence today - No lymphadenopathy - Recommend regular full body skin exams - Recommend daily broad spectrum sunscreen SPF 30+ to sun-exposed areas, reapply every 2 hours as needed.  - Call if any new or changing lesions are noted between office visits   HISTORY OF SQUAMOUS CELL CARCINOMA OF THE SKIN. Right upper back, right neck.  - No evidence of recurrence today - No lymphadenopathy - Recommend regular full body skin exams - Recommend daily broad spectrum sunscreen SPF 30+ to sun-exposed areas, reapply every 2 hours as needed.  - Call if any new or changing lesions are noted between office visits   HISTORY OF DYSPLASTIC NEVUS. Left earlobe. Severe atypia. 2019.  No evidence of recurrence today Recommend regular full body skin exams Recommend daily broad spectrum sunscreen SPF 30+ to sun-exposed areas, reapply every 2 hours as needed.  Call if any new or changing lesions are noted between office visits  EPIDERMAL INCLUSION CYST Exam: Subcutaneous nodule at lower mid chest 6.0 x 4.5 cm   Benign-appearing. Exam most consistent with an epidermal inclusion cyst. Discussed that a cyst is a benign growth that can grow over time and sometimes get irritated or inflamed. Recommend observation if it is not bothersome. Discussed option of surgical excision to remove it if  it is growing, symptomatic, or other changes noted. Please call for new or changing lesions so they can be evaluated. Patient will call insurance re: excision. Codes: 88593 and [id number]  TINEA PEDIS onychomycosis Exam: Scaling  and maceration web spaces and over distal and lateral soles, bilateral. Discolored dystrophic nails  Treatment Plan: Continue Vicks Vapor Rub daily     AK (ACTINIC KERATOSIS) (16) Scalp x 15, R temple x 1 (16) Actinic keratoses are precancerous spots that appear secondary to cumulative UV radiation exposure/sun exposure over time. They are chronic with expected duration over 1 year. A portion of actinic keratoses will progress to squamous cell carcinoma of the skin. It is not possible to reliably predict which spots will progress to skin cancer and so treatment is recommended to prevent development of skin cancer.  Recommend daily broad spectrum sunscreen SPF 30+ to sun-exposed areas, reapply every 2 hours as needed.  Recommend staying in the shade or wearing long sleeves, sun glasses (UVA+UVB protection) and wide brim hats (4-inch brim around the entire circumference of the hat). Call for new or changing lesions. Destruction of lesion - Scalp x 15, R temple x 1 (16) Complexity: simple   Destruction method: cryotherapy   Informed consent: discussed and consent obtained   Timeout:  patient name, date of birth, surgical site, and procedure verified Lesion destroyed using liquid nitrogen: Yes   Region frozen until ice ball extended beyond lesion: Yes   Cryo cycles: 1 or 2. Outcome: patient tolerated procedure well with no complications   Post-procedure details: wound care instructions given    NEOPLASM OF UNCERTAIN BEHAVIOR OF SKIN mid abdomen Skin / nail biopsy Type of biopsy: tangential   Informed consent: discussed and consent obtained   Timeout: patient name, date of birth, surgical site, and procedure verified   Procedure prep:  Patient was prepped and draped in usual sterile fashion Prep type:  Isopropyl alcohol  Anesthesia: the lesion was anesthetized in a standard fashion   Anesthetic:  1% lidocaine  w/ epinephrine 1-100,000 buffered w/ 8.4% NaHCO3 Instrument used: DermaBlade    Hemostasis achieved with: pressure and aluminum chloride   Outcome: patient tolerated procedure well   Post-procedure details: sterile dressing applied and wound care instructions given   Dressing type: bandage and petrolatum    Specimen 1 - Surgical pathology Differential Diagnosis: SK vs Dysplastic vs Melanoma  Check Margins: No MULTIPLE BENIGN NEVI   SEBORRHEIC KERATOSES   LENTIGINES   ACTINIC ELASTOSIS   CHERRY ANGIOMA   ONYCHOMYCOSIS   TINEA PEDIS OF BOTH FEET   EIC (EPIDERMAL INCLUSION CYST)   Return in about 1 year (around 12/16/2024) for TBSE, with Dr. Claudene, HxDN, HxSCC.  LILLETTE Lonell Drones, RMA, am acting as scribe for Boneta Claudene, MD .   Documentation: I have reviewed the above documentation for accuracy and completeness, and I agree with the above.  Boneta Claudene, MD

## 2023-12-17 NOTE — Patient Instructions (Addendum)
 Codes to ask insurance: [id number] and [phone] Epidermoid cyst   Wound Care Instructions  Cleanse wound gently with soap and water once a day then pat dry with clean gauze. Apply a thin coat of Petrolatum (petroleum jelly, Vaseline) over the wound (unless you have an allergy to this). We recommend that you use a new, sterile tube of Vaseline. Do not pick or remove scabs. Do not remove the yellow or white healing tissue from the base of the wound.  Cover the wound with fresh, clean, nonstick gauze and secure with paper tape. You may use Band-Aids in place of gauze and tape if the wound is small enough, but would recommend trimming much of the tape off as there is often too much. Sometimes Band-Aids can irritate the skin.  You should call the office for your biopsy report after 1 week if you have not already been contacted.  If you experience any problems, such as abnormal amounts of bleeding, swelling, significant bruising, significant pain, or evidence of infection, please call the office immediately.  FOR ADULT SURGERY PATIENTS: If you need something for pain relief you may take 1 extra strength Tylenol (acetaminophen) AND 2 Ibuprofen (200mg  each) together every 4 hours as needed for pain. (do not take these if you are allergic to them or if you have a reason you should not take them.) Typically, you may only need pain medication for 1 to 3 days.   Melanoma ABCDEs  Melanoma is the most dangerous type of skin cancer, and is the leading cause of death from skin disease.  You are more likely to develop melanoma if you: Have light-colored skin, light-colored eyes, or red or blond hair Spend a lot of time in the sun Tan regularly, either outdoors or in a tanning bed Have had blistering sunburns, especially during childhood Have a close family member who has had a melanoma Have atypical moles or large birthmarks  Early detection of melanoma is key since treatment is typically straightforward and  cure rates are extremely high if we catch it early.   The first sign of melanoma is often a change in a mole or a new dark spot.  The ABCDE system is a way of remembering the signs of melanoma.  A for asymmetry:  The two halves do not match. B for border:  The edges of the growth are irregular. C for color:  A mixture of colors are present instead of an even brown color. D for diameter:  Melanomas are usually (but not always) greater than 6mm - the size of a pencil eraser. E for evolution:  The spot keeps changing in size, shape, and color.  Please check your skin once per month between visits. You can use a small mirror in front and a large mirror behind you to keep an eye on the back side or your body.   If you see any new or changing lesions before your next follow-up, please call to schedule a visit.  Please continue daily skin protection including broad spectrum sunscreen SPF 30+ to sun-exposed areas, reapplying every 2 hours as needed when you're outdoors.    Due to recent changes in healthcare laws, you may see results of your pathology and/or laboratory studies on MyChart before the doctors have had a chance to review them. We understand that in some cases there may be results that are confusing or concerning to you. Please understand that not all results are received at the same time and often the doctors  may need to interpret multiple results in order to provide you with the best plan of care or course of treatment. Therefore, we ask that you please give us  2 business days to thoroughly review all your results before contacting the office for clarification. Should we see a critical lab result, you will be contacted sooner.   If You Need Anything After Your Visit  If you have any questions or concerns for your doctor, please call our main line at [phone] and press option 4 to reach your doctor's medical assistant. If no one answers, please leave a voicemail as directed and we will  return your call as soon as possible. Messages left after 4 pm will be answered the following business day.   You may also send us  a message via MyChart. We typically respond to MyChart messages within 1-2 business days.  For prescription refills, please ask your pharmacy to contact our office. Our fax number is [phone].  If you have an urgent issue when the clinic is closed that cannot wait until the next business day, you can page your doctor at the number below.    Please note that while we do our best to be available for urgent issues outside of office hours, we are not available 24/7.   If you have an urgent issue and are unable to reach us , you may choose to seek medical care at your doctor's office, retail clinic, urgent care center, or emergency room.  If you have a medical emergency, please immediately call 911 or go to the emergency department.  Pager Numbers  - Dr. Hester: [phone]  - Dr. Jackquline: [phone]  - Dr. Claudene: [phone]   - Dr. Raymund: [phone]  In the event of inclement weather, please call our main line at [phone] for an update on the status of any delays or closures.  Dermatology Medication Tips: Please keep the boxes that topical medications come in in order to help keep track of the instructions about where and how to use these. Pharmacies typically print the medication instructions only on the boxes and not directly on the medication tubes.   If your medication is too expensive, please contact our office at [phone] option 4 or send us  a message through MyChart.   We are unable to tell what your co-pay for medications will be in advance as this is different depending on your insurance coverage. However, we may be able to find a substitute medication at lower cost or fill out paperwork to get insurance to cover a needed medication.   If a prior authorization is required to get your medication covered by your insurance company,  please allow us  1-2 business days to complete this process.  Drug prices often vary depending on where the prescription is filled and some pharmacies may offer cheaper prices.  The website www.goodrx.com contains coupons for medications through different pharmacies. The prices here do not account for what the cost may be with help from insurance (it may be cheaper with your insurance), but the website can give you the price if you did not use any insurance.  - You can print the associated coupon and take it with your prescription to the pharmacy.  - You may also stop by our office during regular business hours and pick up a GoodRx coupon card.  - If you need your prescription sent electronically to a different pharmacy, notify our office through [facility] or by phone at [phone] option 4.  Si Usted Necesita Algo Despus de Su Visita  Tambin puede enviarnos un mensaje a travs de Clinical cytogeneticist. Por lo general respondemos a los mensajes de MyChart en el transcurso de 1 a 2 das hbiles.  Para renovar recetas, por favor pida a su farmacia que se ponga en contacto con nuestra oficina. Randi lakes de fax es [place] [phone].  Si tiene un asunto urgente cuando la clnica est cerrada y que no puede esperar hasta el siguiente da hbil, puede llamar/localizar a su doctor(a) al nmero que aparece a continuacin.   Por favor, tenga en cuenta que aunque hacemos todo lo posible para estar disponibles para asuntos urgentes fuera del horario de [place], no estamos disponibles las 24 horas del da, los 7 [address]  [address] de la [place].   Si tiene un problema urgente y no puede comunicarse con nosotros, puede optar por buscar atencin mdica  en el consultorio de su doctor(a), en una clnica privada, en un centro de atencin urgente o en una sala de emergencias.  Si tiene Engineer, drilling, por favor llame inmediatamente al 911 o vaya a la sala de emergencias.  Nmeros de bper  - Dr. Hester:  [phone]  - Dra. Jackquline: [phone]  - Dr. Claudene: [phone]  - Dra. Kitts: [phone]  En caso de inclemencias del [place], por favor llame a nuestra lnea principal al [phone] para una actualizacin sobre el estado de cualquier retraso o cierre.  Consejos para la medicacin en dermatologa: Por favor, guarde las cajas en las que vienen los medicamentos de uso tpico para ayudarle a seguir las instrucciones sobre dnde y cmo usarlos. Las farmacias generalmente imprimen las instrucciones del medicamento slo en las cajas y no directamente en los tubos del [place].   Si su medicamento es muy caro, por favor, pngase en contacto con landry rieger llamando al [phone] y presione la opcin 4 o envenos un mensaje a travs de Clinical cytogeneticist.   No podemos decirle cul ser su copago por los medicamentos por adelantado ya que esto es diferente dependiendo de la cobertura de su seguro. Sin embargo, es posible que podamos encontrar un medicamento sustituto a Audiological scientist un formulario para que el seguro cubra el medicamento que se considera necesario.   Si se requiere una autorizacin previa para que su compaa de seguros malta su medicamento, por favor permtanos de 1 a 2 das hbiles para completar este proceso.  Los precios de los medicamentos varan con frecuencia dependiendo del Environmental consultant de dnde se surte la receta y alguna farmacias pueden ofrecer precios ms baratos.  El sitio web www.goodrx.com tiene cupones para medicamentos de Health and safety inspector. Los precios aqu no tienen en cuenta lo que podra costar con la ayuda del seguro (puede ser ms barato con su seguro), pero el sitio web puede darle el precio si no utiliz Tourist information centre manager.  - Puede imprimir el cupn correspondiente y llevarlo con su receta a la farmacia.  - Tambin puede pasar por nuestra oficina durante el horario de atencin regular y Education officer, museum una tarjeta de cupones de GoodRx.  - Si necesita que su receta  se enve electrnicamente a una farmacia diferente, informe a nuestra oficina a travs de MyChart de [place] o por telfono llamando al [phone] y presione la opcin 4.

## 2023-12-21 ENCOUNTER — Ambulatory Visit: Payer: Self-pay | Admitting: Dermatology

## 2023-12-21 LAB — SURGICAL PATHOLOGY

## 2023-12-22 NOTE — Telephone Encounter (Signed)
-----   Message from Robbins sent at 12/21/2023  5:44 PM EDT ----- Diagnosis: mid abdomen :       PIGMENTED SEBORRHEIC KERATOSIS, EARLY   Plan: please call to share that biopsy from abdomen shows benign thickening of the top layer of skin. No treatment needed. ----- Message ----- From: Interface, Lab In [facility] [place] Sent: 12/21/2023   4:53 PM EDT To: Boneta Sharps, MD

## 2023-12-22 NOTE — Telephone Encounter (Signed)
Discussed pathology results. Patient voiced understanding.

## 2024-01-18 DIAGNOSIS — H401232 Low-tension glaucoma, bilateral, moderate stage: Secondary | ICD-10-CM | POA: Diagnosis not present

## 2024-01-18 DIAGNOSIS — H2513 Age-related nuclear cataract, bilateral: Secondary | ICD-10-CM | POA: Diagnosis not present

## 2024-01-18 DIAGNOSIS — E119 Type 2 diabetes mellitus without complications: Secondary | ICD-10-CM | POA: Diagnosis not present

## 2024-02-02 DIAGNOSIS — E119 Type 2 diabetes mellitus without complications: Secondary | ICD-10-CM | POA: Diagnosis not present

## 2024-02-09 DIAGNOSIS — E1142 Type 2 diabetes mellitus with diabetic polyneuropathy: Secondary | ICD-10-CM | POA: Diagnosis not present

## 2024-02-09 DIAGNOSIS — K760 Fatty (change of) liver, not elsewhere classified: Secondary | ICD-10-CM | POA: Diagnosis not present

## 2024-02-09 DIAGNOSIS — E785 Hyperlipidemia, unspecified: Secondary | ICD-10-CM | POA: Diagnosis not present

## 2024-02-09 DIAGNOSIS — Z125 Encounter for screening for malignant neoplasm of prostate: Secondary | ICD-10-CM | POA: Diagnosis not present

## 2024-02-09 DIAGNOSIS — I10 Essential (primary) hypertension: Secondary | ICD-10-CM | POA: Diagnosis not present

## 2024-12-15 ENCOUNTER — Ambulatory Visit: Admitting: Dermatology
